# Patient Record
Sex: Female | Born: 1950 | Race: Black or African American | Hispanic: No | Smoking: Never smoker
Health system: Southern US, Community
[De-identification: ages and names within clinical notes are randomized; demographics above are authoritative.]

## PROBLEM LIST (undated history)

## (undated) DIAGNOSIS — IMO0001 Reserved for inherently not codable concepts without codable children: Secondary | ICD-10-CM

## (undated) DIAGNOSIS — S2239XA Fracture of one rib, unspecified side, initial encounter for closed fracture: Secondary | ICD-10-CM

## (undated) DIAGNOSIS — Z531 Procedure and treatment not carried out because of patient's decision for reasons of belief and group pressure: Secondary | ICD-10-CM

## (undated) DIAGNOSIS — Z923 Personal history of irradiation: Secondary | ICD-10-CM

## (undated) DIAGNOSIS — I10 Essential (primary) hypertension: Secondary | ICD-10-CM

## (undated) DIAGNOSIS — C50919 Malignant neoplasm of unspecified site of unspecified female breast: Secondary | ICD-10-CM

## (undated) DIAGNOSIS — K219 Gastro-esophageal reflux disease without esophagitis: Secondary | ICD-10-CM

## (undated) HISTORY — DX: Personal history of irradiation: Z92.3

## (undated) HISTORY — DX: Essential (primary) hypertension: I10

## (undated) HISTORY — PX: DILATION AND CURETTAGE OF UTERUS: SHX78

## (undated) HISTORY — DX: Malignant neoplasm of unspecified site of unspecified female breast: C50.919

## (undated) HISTORY — DX: Reserved for inherently not codable concepts without codable children: IMO0001

## (undated) HISTORY — DX: Procedure and treatment not carried out because of patient's decision for reasons of belief and group pressure: Z53.1

---

## 1998-07-31 ENCOUNTER — Other Ambulatory Visit: Admission: RE | Admit: 1998-07-31 | Discharge: 1998-07-31 | Payer: Self-pay | Admitting: Obstetrics and Gynecology

## 1999-06-14 ENCOUNTER — Emergency Department (HOSPITAL_COMMUNITY): Admission: EM | Admit: 1999-06-14 | Discharge: 1999-06-14 | Payer: Self-pay | Admitting: Emergency Medicine

## 2000-03-07 ENCOUNTER — Other Ambulatory Visit: Admission: RE | Admit: 2000-03-07 | Discharge: 2000-03-07 | Payer: Self-pay | Admitting: Obstetrics and Gynecology

## 2014-06-13 DIAGNOSIS — S2239XA Fracture of one rib, unspecified side, initial encounter for closed fracture: Secondary | ICD-10-CM

## 2014-06-13 HISTORY — DX: Fracture of one rib, unspecified side, initial encounter for closed fracture: S22.39XA

## 2015-02-23 ENCOUNTER — Emergency Department (HOSPITAL_COMMUNITY): Payer: Medicare Other

## 2015-02-23 ENCOUNTER — Encounter (HOSPITAL_COMMUNITY): Payer: Self-pay | Admitting: Emergency Medicine

## 2015-02-23 ENCOUNTER — Observation Stay (HOSPITAL_COMMUNITY)
Admission: EM | Admit: 2015-02-23 | Discharge: 2015-02-26 | Disposition: A | Discharge status: Home / Self Care | Payer: Medicare Other | Attending: Surgery | Admitting: Surgery

## 2015-02-23 DIAGNOSIS — R0781 Pleurodynia: Secondary | ICD-10-CM | POA: Diagnosis not present

## 2015-02-23 DIAGNOSIS — S199XXA Unspecified injury of neck, initial encounter: Secondary | ICD-10-CM | POA: Diagnosis not present

## 2015-02-23 DIAGNOSIS — S3991XA Unspecified injury of abdomen, initial encounter: Secondary | ICD-10-CM | POA: Diagnosis not present

## 2015-02-23 DIAGNOSIS — S2231XA Fracture of one rib, right side, initial encounter for closed fracture: Secondary | ICD-10-CM | POA: Diagnosis present

## 2015-02-23 DIAGNOSIS — S0990XA Unspecified injury of head, initial encounter: Secondary | ICD-10-CM | POA: Diagnosis not present

## 2015-02-23 DIAGNOSIS — S20211A Contusion of right front wall of thorax, initial encounter: Principal | ICD-10-CM | POA: Insufficient documentation

## 2015-02-23 DIAGNOSIS — S2190XA Unspecified open wound of unspecified part of thorax, initial encounter: Secondary | ICD-10-CM | POA: Diagnosis not present

## 2015-02-23 DIAGNOSIS — S2243XA Multiple fractures of ribs, bilateral, initial encounter for closed fracture: Secondary | ICD-10-CM | POA: Insufficient documentation

## 2015-02-23 DIAGNOSIS — S2249XA Multiple fractures of ribs, unspecified side, initial encounter for closed fracture: Secondary | ICD-10-CM | POA: Diagnosis not present

## 2015-02-23 DIAGNOSIS — S20219A Contusion of unspecified front wall of thorax, initial encounter: Secondary | ICD-10-CM | POA: Diagnosis present

## 2015-02-23 DIAGNOSIS — W228XXA Striking against or struck by other objects, initial encounter: Secondary | ICD-10-CM | POA: Insufficient documentation

## 2015-02-23 DIAGNOSIS — S2232XA Fracture of one rib, left side, initial encounter for closed fracture: Secondary | ICD-10-CM

## 2015-02-23 DIAGNOSIS — S298XXA Other specified injuries of thorax, initial encounter: Secondary | ICD-10-CM | POA: Diagnosis present

## 2015-02-23 DIAGNOSIS — X58XXXA Exposure to other specified factors, initial encounter: Secondary | ICD-10-CM | POA: Diagnosis not present

## 2015-02-23 DIAGNOSIS — S299XXA Unspecified injury of thorax, initial encounter: Secondary | ICD-10-CM | POA: Diagnosis not present

## 2015-02-23 DIAGNOSIS — T1490XA Injury, unspecified, initial encounter: Secondary | ICD-10-CM

## 2015-02-23 DIAGNOSIS — S2242XA Multiple fractures of ribs, left side, initial encounter for closed fracture: Secondary | ICD-10-CM | POA: Diagnosis not present

## 2015-02-23 LAB — CBC
HEMATOCRIT: 41.3 % (ref 36.0–46.0)
HEMOGLOBIN: 13.2 g/dL (ref 12.0–15.0)
MCH: 29.4 pg (ref 26.0–34.0)
MCHC: 32 g/dL (ref 30.0–36.0)
MCV: 92 fL (ref 78.0–100.0)
Platelets: 221 10*3/uL (ref 150–400)
RBC: 4.49 MIL/uL (ref 3.87–5.11)
RDW: 12.3 % (ref 11.5–15.5)
WBC: 13.9 10*3/uL — ABNORMAL HIGH (ref 4.0–10.5)

## 2015-02-23 LAB — COMPREHENSIVE METABOLIC PANEL
ALBUMIN: 3.8 g/dL (ref 3.5–5.0)
ALK PHOS: 112 U/L (ref 38–126)
ALT: 27 U/L (ref 14–54)
ANION GAP: 12 (ref 5–15)
AST: 33 U/L (ref 15–41)
BILIRUBIN TOTAL: 0.6 mg/dL (ref 0.3–1.2)
BUN: 15 mg/dL (ref 6–20)
CALCIUM: 9.6 mg/dL (ref 8.9–10.3)
CO2: 19 mmol/L — AB (ref 22–32)
Chloride: 110 mmol/L (ref 101–111)
Creatinine, Ser: 1.41 mg/dL — ABNORMAL HIGH (ref 0.44–1.00)
GFR calc non Af Amer: 38 mL/min — ABNORMAL LOW (ref >60)
GFR, EST AFRICAN AMERICAN: 44 mL/min — AB (ref >60)
Glucose, Bld: 163 mg/dL — ABNORMAL HIGH (ref 65–99)
POTASSIUM: 3.9 mmol/L (ref 3.5–5.1)
SODIUM: 141 mmol/L (ref 135–145)
TOTAL PROTEIN: 8.2 g/dL — AB (ref 6.5–8.1)

## 2015-02-23 LAB — SAMPLE TO BLOOD BANK

## 2015-02-23 LAB — PROTIME-INR
INR: 1.07 (ref 0.00–1.49)
PROTHROMBIN TIME: 14.1 s (ref 11.6–15.2)

## 2015-02-23 LAB — ETHANOL

## 2015-02-23 MED ORDER — ENOXAPARIN SODIUM 40 MG/0.4ML ~~LOC~~ SOLN
40.0000 mg | SUBCUTANEOUS | Status: DC
  Administered 2015-02-24: 40 mg via SUBCUTANEOUS
  Filled 2015-02-23 (×3): qty 0.4

## 2015-02-23 MED ORDER — DEXTROSE-NACL 5-0.9 % IV SOLN
INTRAVENOUS | Status: DC
  Administered 2015-02-24: 01:00:00 via INTRAVENOUS

## 2015-02-23 MED ORDER — IOHEXOL 300 MG/ML  SOLN
100.0000 mL | Freq: Once | INTRAMUSCULAR | Status: AC | PRN
  Administered 2015-02-23: 100 mL via INTRAVENOUS

## 2015-02-23 MED ORDER — FENTANYL CITRATE (PF) 100 MCG/2ML IJ SOLN
INTRAMUSCULAR | Status: AC
  Filled 2015-02-23: qty 2

## 2015-02-23 MED ORDER — OXYCODONE HCL 5 MG PO TABS: 10.0000 mg | ORAL_TABLET | ORAL | Status: DC | PRN

## 2015-02-23 MED ORDER — ONDANSETRON HCL 4 MG PO TABS
4.0000 mg | ORAL_TABLET | Freq: Four times a day (QID) | ORAL | Status: DC | PRN
Start: 2015-02-23 — End: 2015-02-26

## 2015-02-23 MED ORDER — TETANUS-DIPHTH-ACELL PERTUSSIS 5-2.5-18.5 LF-MCG/0.5 IM SUSP
INTRAMUSCULAR | Status: AC
  Filled 2015-02-23: qty 0.5

## 2015-02-23 MED ORDER — SODIUM CHLORIDE 0.9 % IV SOLN
1000.0000 mL | Freq: Once | INTRAVENOUS | Status: AC
  Administered 2015-02-23: 1000 mL via INTRAVENOUS

## 2015-02-23 MED ORDER — TETANUS-DIPHTH-ACELL PERTUSSIS 5-2.5-18.5 LF-MCG/0.5 IM SUSP
0.5000 mL | Freq: Once | INTRAMUSCULAR | Status: AC
  Administered 2015-02-23: 0.5 mL via INTRAMUSCULAR

## 2015-02-23 MED ORDER — HYDROMORPHONE HCL 1 MG/ML IJ SOLN
INTRAMUSCULAR | Status: AC
  Administered 2015-02-24: 1 mg via INTRAVENOUS
  Filled 2015-02-23: qty 1

## 2015-02-23 MED ORDER — SODIUM CHLORIDE 0.9 % IV SOLN
1000.0000 mL | INTRAVENOUS | Status: DC
  Administered 2015-02-23: 1000 mL via INTRAVENOUS

## 2015-02-23 MED ORDER — HYDROMORPHONE HCL 1 MG/ML IJ SOLN
1.0000 mg | INTRAMUSCULAR | Status: DC | PRN
  Administered 2015-02-23 – 2015-02-24 (×3): 1 mg via INTRAVENOUS
  Filled 2015-02-23 (×2): qty 1

## 2015-02-23 MED ORDER — FENTANYL CITRATE (PF) 100 MCG/2ML IJ SOLN
INTRAMUSCULAR | Status: DC | PRN
  Administered 2015-02-23: 50 ug via INTRAVENOUS

## 2015-02-23 MED ORDER — ONDANSETRON HCL 4 MG/2ML IJ SOLN: 4.0000 mg | Freq: Four times a day (QID) | INTRAMUSCULAR | Status: DC | PRN

## 2015-02-23 NOTE — ED Provider Notes (Signed)
CSN: [id number]     Arrival date & time 02/23/15  1911 History   First MD Initiated Contact with Patient 02/23/15 1923     Chief Complaint  Patient presents with  . Trauma     (Consider location/radiation/quality/duration/timing/severity/associated sxs/prior Treatment) Patient is a 64 y.o. female presenting with trauma.  Trauma Mechanism of injury: crush injury Injury location: torso Injury location detail: back and R chest Incident location: home Time since incident: 1 hour Arrived directly from scene: yes  Crush injury:      Mechanism: gazebo fell on her.      Duration of crushing force: 30 seconds   Protective equipment:       None  EMS/PTA data:      Blood loss: none      Responsiveness: alert      Loss of consciousness: no      Amnesic to event: no      Immobilization: C-collar  Current symptoms:      Pain scale: 10/10      Pain quality: stabbing      Associated symptoms:            Reports back pain and chest pain.            Denies abdominal pain, headache, loss of consciousness, nausea and vomiting.   Relevant PMH:      Pharmacological risk factors:            No anticoagulation therapy.       Tetanus status: UTD   History reviewed. No pertinent past medical history. History reviewed. No pertinent past surgical history. No family history on file. Social History  Substance Use Topics  . Smoking status: Never Smoker   . Smokeless tobacco: None  . Alcohol Use: None   OB History    No data available     Review of Systems  Constitutional: Negative for fever and chills.  HENT: Negative for congestion and sore throat.   Eyes: Negative for visual disturbance.  Respiratory: Positive for shortness of breath. Negative for cough and wheezing.   Cardiovascular: Positive for chest pain.  Gastrointestinal: Negative for nausea, vomiting, abdominal pain, diarrhea and constipation.  Genitourinary: Negative for dysuria, difficulty urinating and vaginal pain.    Musculoskeletal: Positive for back pain.  Skin: Negative for rash.  Neurological: Negative for loss of consciousness, syncope and headaches.  Psychiatric/Behavioral: Negative for behavioral problems.  All other systems reviewed and are negative.     Allergies  Review of patient's allergies indicates no known allergies.  Home Medications   Prior to Admission medications   Not on File   BP 99/66 mmHg  Pulse 88  Temp(Src) 97.9 F (36.6 C) (Oral)  Resp 29  Ht 5\' 5"  (1.651 m)  Wt 237 lb (107.502 kg)  BMI 39.44 kg/m2  SpO2 94% Physical Exam  Constitutional: She is oriented to person, place, and time. She appears well-developed and well-nourished. She appears distressed.  HENT:  Head: Normocephalic and atraumatic.  Eyes: EOM are normal.  Neck: Normal range of motion.  Cardiovascular: Normal rate, regular rhythm and normal heart sounds.   No murmur heard. Pulmonary/Chest: Breath sounds normal. No respiratory distress. She has no wheezes.   She exhibits tenderness.    Abdominal: Soft. She exhibits no distension. There is no tenderness.  Musculoskeletal: She exhibits tenderness.       Cervical back: She exhibits no tenderness.       Thoracic back: She exhibits tenderness.  Neurological: She is  alert and oriented to person, place, and time.  Skin: She is not diaphoretic.  Psychiatric: She has a normal mood and affect. Her behavior is normal.    ED Course  Procedures (including critical care time) Labs Review Labs Reviewed  COMPREHENSIVE METABOLIC PANEL - Abnormal; Notable for the following:    CO2 19 (*)    Glucose, Bld 163 (*)    Creatinine, Ser 1.41 (*)    Total Protein 8.2 (*)    GFR calc non Af Amer 38 (*)    GFR calc Af Amer 44 (*)    All other components within normal limits  CBC - Abnormal; Notable for the following:    WBC 13.9 (*)    All other components within normal limits  ETHANOL  PROTIME-INR  CBC  COMPREHENSIVE METABOLIC PANEL  SAMPLE TO BLOOD  BANK    Imaging Review Ct Head Wo Contrast  02/23/2015   CLINICAL DATA:  64 year old female with trauma  EXAM: CT HEAD WITHOUT CONTRAST  CT CERVICAL SPINE WITHOUT CONTRAST  TECHNIQUE: Multidetector CT imaging of the head and cervical spine was performed following the standard protocol without intravenous contrast. Multiplanar CT image reconstructions of the cervical spine were also generated.  COMPARISON:  None.  FINDINGS: CT HEAD FINDINGS  The ventricles and sulci are appropriate in size for the patient's age. There is no intracranial hemorrhage. No mass effect or midline shift identified. The gray-white matter differentiation is preserved. There is no extra-axial fluid collection.  The visualized paranasal sinuses and mastoid air cells are well aerated. The calvarium is intact.  CT CERVICAL SPINE FINDINGS  There is no acute fracture or subluxation of the cervical spine.Mild degenerative changes.The odontoid and spinous processes are intact.There is normal anatomic alignment of the C1-C2 lateral masses. The visualized soft tissues appear unremarkable.  IMPRESSION: No acute intracranial pathology.  No acute/ traumatic cervical spine pathology.   Electronically Signed   By: Anner Crete M.D.   On: 02/23/2015 21:43   Ct Chest W Contrast  02/23/2015   CLINICAL DATA:  64 year old female with trauma  EXAM: CT CHEST, ABDOMEN, AND PELVIS WITH CONTRAST  TECHNIQUE: Multidetector CT imaging of the chest, abdomen and pelvis was performed following the standard protocol during bolus administration of intravenous contrast.  CONTRAST:  114mL OMNIPAQUE IOHEXOL 300 MG/ML  SOLN  COMPARISON:  None.  FINDINGS: CT CHEST FINDINGS  Bibasilar dependent atelectatic changes/scarring. There is no focal consolidation, pleural effusion, or pneumothorax. The central airways are patent.  The thoracic aorta and pulmonary arteries appear unremarkable. No cardiomegaly or pericardial effusion. The thyroid gland is unremarkable. The  esophagus is predominantly collapsed. A small pocket of air is noted posterior to the sternum.  There is no axillary adenopathy. Small amount of air noted in the right chest wall deep to the pectoralis muscle. There is a mildly displaced fracture of the left anterior sixth rib. Nondisplaced fractures of the left fifth and seventh ribs noted. There is a nondisplaced fracture of the posterior right eighth rib.  CT ABDOMEN AND PELVIS FINDINGS  There is no intra-abdominal free air or free fluid.  The liver, gallbladder, pancreas, spleen, adrenal glands, kidneys, visualized ureters, and urinary bladder appear unremarkable. Multiple uterine lesions noted with the largest measuring approximately 5.0 x 3.8 cm along the right uterine body. An intracavitary lesion as well as an exophytic and partially calcified lesion noted. These findings are most compatible with fibroids. Ultrasound recommended for further evaluation of the pelvic structures with  There is  no evidence of bowel obstruction or inflammation. Normal appendix.  The abdominal aorta and IVC appear unremarkable. No portal venous gas identified. There is no lymphadenopathy. The osseous structures are intact. 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 Stop  IMPRESSION: Bilateral rib fractures with a small amount of soft tissue air in the right anterior chest wall. No pneumothorax.  No acute/ traumatic intra-abdominal or pelvic pathology identified.  The above findings were discussed in person with Dr. Brantley Stage on 02/23/2015 at 9:18 pm.   Electronically Signed   By: Anner Crete M.D.   On: 02/23/2015 21:40   Ct Cervical Spine Wo Contrast  02/23/2015   CLINICAL DATA:  64 year old female with trauma  EXAM: CT HEAD WITHOUT CONTRAST  CT CERVICAL SPINE WITHOUT CONTRAST  TECHNIQUE: Multidetector CT imaging of the head and cervical spine was performed following the standard protocol without intravenous contrast. Multiplanar CT image  reconstructions of the cervical spine were also generated.  COMPARISON:  None.  FINDINGS: CT HEAD FINDINGS  The ventricles and sulci are appropriate in size for the patient's age. There is no intracranial hemorrhage. No mass effect or midline shift identified. The gray-white matter differentiation is preserved. There is no extra-axial fluid collection.  The visualized paranasal sinuses and mastoid air cells are well aerated. The calvarium is intact.  CT CERVICAL SPINE FINDINGS  There is no acute fracture or subluxation of the cervical spine.Mild degenerative changes.The odontoid and spinous processes are intact.There is normal anatomic alignment of the C1-C2 lateral masses. The visualized soft tissues appear unremarkable.  IMPRESSION: No acute intracranial pathology.  No acute/ traumatic cervical spine pathology.   Electronically Signed   By: Anner Crete M.D.   On: 02/23/2015 21:43   Ct Abdomen Pelvis W Contrast  02/23/2015   CLINICAL DATA:  64 year old female with trauma  EXAM: CT CHEST, ABDOMEN, AND PELVIS WITH CONTRAST  TECHNIQUE: Multidetector CT imaging of the chest, abdomen and pelvis was performed following the standard protocol during bolus administration of intravenous contrast.  CONTRAST:  129mL OMNIPAQUE IOHEXOL 300 MG/ML  SOLN  COMPARISON:  None.  FINDINGS: CT CHEST FINDINGS  Bibasilar dependent atelectatic changes/scarring. There is no focal consolidation, pleural effusion, or pneumothorax. The central airways are patent.  The thoracic aorta and pulmonary arteries appear unremarkable. No cardiomegaly or pericardial effusion. The thyroid gland is unremarkable. The esophagus is predominantly collapsed. A small pocket of air is noted posterior to the sternum.  There is no axillary adenopathy. Small amount of air noted in the right chest wall deep to the pectoralis muscle. There is a mildly displaced fracture of the left anterior sixth rib. Nondisplaced fractures of the left fifth and seventh ribs  noted. There is a nondisplaced fracture of the posterior right eighth rib.  CT ABDOMEN AND PELVIS FINDINGS  There is no intra-abdominal free air or free fluid.  The liver, gallbladder, pancreas, spleen, adrenal glands, kidneys, visualized ureters, and urinary bladder appear unremarkable. Multiple uterine lesions noted with the largest measuring approximately 5.0 x 3.8 cm along the right uterine body. An intracavitary lesion as well as an exophytic and partially calcified lesion noted. These findings are most compatible with fibroids. Ultrasound recommended for further evaluation of the pelvic structures with  There is no evidence of bowel obstruction or inflammation. Normal appendix.  The abdominal aorta and IVC appear unremarkable. No portal venous gas identified. There is no lymphadenopathy. The osseous structures are intact. A 4 x 6 cm intramuscular lipoma  is noted in the left gluteus region. Stop  IMPRESSION: Bilateral rib fractures with a small amount of soft tissue air in the right anterior chest wall. No pneumothorax.  No acute/ traumatic intra-abdominal or pelvic pathology identified.  The above findings were discussed in person with Dr. Brantley Stage on 02/23/2015 at 9:18 pm.   Electronically Signed   By: Anner Crete M.D.   On: 02/23/2015 21:40   Dg Chest Portable 1 View  02/23/2015   CLINICAL DATA:  64 year old female trauma  EXAM: PORTABLE CHEST - 1 VIEW  COMPARISON:  None.  FINDINGS: Single portable view of the chest demonstrate shallow inspiratory effort. There is no focal consolidation, pleural effusion or pneumothorax. A patchy area of opacity in the upper mediastinum may be artifactual or represent an area of contusion. CT is recommended for further evaluation if there is high clinical concern for mediastinal injury. The cardiac silhouette is within normal limits. The osseous structures appear unremarkable.  IMPRESSION: Ill-defined area opacity over the upper mediastinum may be artifactual or  represent an area of contusion. No other intrathoracic traumatic injury identified.   Electronically Signed   By: Anner Crete M.D.   On: 02/23/2015 19:56   I have personally reviewed and evaluated these images and lab results as part of my medical decision-making.   EKG Interpretation None      MDM   Final diagnoses:  Rib fracture, left, closed, initial encounter  Rib fracture, right, closed, initial encounter     Patient is a 64 year old female that presents after a gazebo fell on top of her. Patient was under the gazebo for approximately 30 seconds denies LOC. Patient now has significant upper back and right-sided chest pain with shortness of breath. On arrival to the ED the patient is tachypneic and in distress. Patient's blood pressure was 90/60. A fast was performed which showed no intraperitoneal or cardiac free fluid however the patient's lung sliding was indeterminate on the right. IV fluids started and patient was upgraded to a level II. Full traumatic workup will be done.  Patient's workup showed bilateral rib fractures with subcutaneous air however no pneumothorax was seen. Patient also has a creatinine 1.5 however there is no baseline to compare to. Patient will be admitted to trauma surgery for pain medication and further evaluation.  Renne Musca, MD 02/23/15 2255  Charlesetta Shanks, MD 02/26/15 [phone]
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
## 2015-02-23 NOTE — ED Notes (Signed)
Pt taken to CT scan.

## 2015-02-23 NOTE — ED Notes (Signed)
Pt returned from CT scan.

## 2015-02-23 NOTE — ED Notes (Signed)
DR. Johnney Killian at the bedside.

## 2015-02-24 ENCOUNTER — Observation Stay (HOSPITAL_COMMUNITY): Payer: Medicare Other

## 2015-02-24 DIAGNOSIS — S2231XA Fracture of one rib, right side, initial encounter for closed fracture: Secondary | ICD-10-CM | POA: Diagnosis present

## 2015-02-24 DIAGNOSIS — S2249XA Multiple fractures of ribs, unspecified side, initial encounter for closed fracture: Secondary | ICD-10-CM | POA: Diagnosis not present

## 2015-02-24 DIAGNOSIS — S20211A Contusion of right front wall of thorax, initial encounter: Secondary | ICD-10-CM | POA: Diagnosis not present

## 2015-02-24 DIAGNOSIS — S298XXA Other specified injuries of thorax, initial encounter: Secondary | ICD-10-CM | POA: Diagnosis present

## 2015-02-24 LAB — MRSA PCR SCREENING: MRSA BY PCR: NEGATIVE

## 2015-02-24 LAB — COMPREHENSIVE METABOLIC PANEL
ALT: 33 U/L (ref 14–54)
ANION GAP: 7 (ref 5–15)
AST: 79 U/L — ABNORMAL HIGH (ref 15–41)
Albumin: 3.6 g/dL (ref 3.5–5.0)
Alkaline Phosphatase: 102 U/L (ref 38–126)
BILIRUBIN TOTAL: 0.7 mg/dL (ref 0.3–1.2)
BUN: 12 mg/dL (ref 6–20)
CO2: 22 mmol/L (ref 22–32)
Calcium: 9.3 mg/dL (ref 8.9–10.3)
Chloride: 113 mmol/L — ABNORMAL HIGH (ref 101–111)
Creatinine, Ser: 0.95 mg/dL (ref 0.44–1.00)
GFR calc Af Amer: >60 mL/min (ref >60)
Glucose, Bld: 152 mg/dL — ABNORMAL HIGH (ref 65–99)
POTASSIUM: 4.3 mmol/L (ref 3.5–5.1)
Sodium: 142 mmol/L (ref 135–145)
TOTAL PROTEIN: 7.4 g/dL (ref 6.5–8.1)

## 2015-02-24 LAB — CBC
HEMATOCRIT: 39.1 % (ref 36.0–46.0)
Hemoglobin: 12.3 g/dL (ref 12.0–15.0)
MCH: 29.3 pg (ref 26.0–34.0)
MCHC: 31.5 g/dL (ref 30.0–36.0)
MCV: 93.1 fL (ref 78.0–100.0)
Platelets: 180 10*3/uL (ref 150–400)
RBC: 4.2 MIL/uL (ref 3.87–5.11)
RDW: 12.4 % (ref 11.5–15.5)
WBC: 14.2 10*3/uL — ABNORMAL HIGH (ref 4.0–10.5)

## 2015-02-24 MED ORDER — WHITE PETROLATUM GEL
Status: AC
  Administered 2015-02-24: 01:00:00
  Filled 2015-02-24: qty 1

## 2015-02-24 MED ORDER — OXYCODONE HCL 5 MG PO TABS
5.0000 mg | ORAL_TABLET | ORAL | Status: DC | PRN
  Administered 2015-02-24 – 2015-02-26 (×8): 15 mg via ORAL
  Filled 2015-02-24 (×9): qty 3

## 2015-02-24 MED ORDER — HYDROMORPHONE HCL 1 MG/ML IJ SOLN
0.5000 mg | INTRAMUSCULAR | Status: DC | PRN
  Administered 2015-02-24 – 2015-02-25 (×3): 0.5 mg via INTRAVENOUS
  Filled 2015-02-24 (×3): qty 1

## 2015-02-24 MED ORDER — DOCUSATE SODIUM 100 MG PO CAPS
100.0000 mg | ORAL_CAPSULE | Freq: Two times a day (BID) | ORAL | Status: DC
  Administered 2015-02-24 – 2015-02-26 (×5): 100 mg via ORAL
  Filled 2015-02-24 (×5): qty 1

## 2015-02-24 MED ORDER — POLYETHYLENE GLYCOL 3350 17 G PO PACK
17.0000 g | PACK | Freq: Every day | ORAL | Status: DC
  Administered 2015-02-25 – 2015-02-26 (×2): 17 g via ORAL
  Filled 2015-02-24 (×3): qty 1

## 2015-02-24 MED ORDER — INFLUENZA VAC SPLIT QUAD 0.5 ML IM SUSY
0.5000 mL | PREFILLED_SYRINGE | INTRAMUSCULAR | Status: AC
  Administered 2015-02-25: 0.5 mL via INTRAMUSCULAR
  Filled 2015-02-24 (×2): qty 0.5

## 2015-02-24 MED ORDER — ENOXAPARIN SODIUM 30 MG/0.3ML ~~LOC~~ SOLN
30.0000 mg | Freq: Two times a day (BID) | SUBCUTANEOUS | Status: DC
  Administered 2015-02-24 – 2015-02-26 (×4): 30 mg via SUBCUTANEOUS
  Filled 2015-02-24 (×4): qty 0.3
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
## 2015-02-25 DIAGNOSIS — S2249XA Multiple fractures of ribs, unspecified side, initial encounter for closed fracture: Secondary | ICD-10-CM | POA: Diagnosis not present

## 2015-02-25 MED ORDER — TRAMADOL HCL 50 MG PO TABS
50.0000 mg | ORAL_TABLET | Freq: Four times a day (QID) | ORAL | Status: DC
  Administered 2015-02-25 – 2015-02-26 (×4): 50 mg via ORAL
  Filled 2015-02-25 (×4): qty 1
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
## 2015-02-26 DIAGNOSIS — S2249XA Multiple fractures of ribs, unspecified side, initial encounter for closed fracture: Secondary | ICD-10-CM | POA: Diagnosis not present

## 2015-02-26 MED ORDER — OXYCODONE-ACETAMINOPHEN 7.5-325 MG PO TABS: 1.0000 | ORAL_TABLET | ORAL | Status: DC | PRN

## 2015-02-26 MED ORDER — TRAMADOL HCL 50 MG PO TABS: 50.0000 mg | ORAL_TABLET | Freq: Four times a day (QID) | ORAL | Status: DC

## 2015-02-26 NOTE — Progress Notes (Signed)
Patient  discharged to home with instruction. 
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
## 2015-02-26 NOTE — Discharge Instructions (Signed)
No driving while taking oxycodone. °

## 2015-03-02 ENCOUNTER — Telehealth: Payer: Self-pay | Admitting: Emergency Medicine

## 2015-03-02 NOTE — Telephone Encounter (Signed)
LM on VM stating not likely allergic rxn or side effect from pain meds and to call PCP.

## 2015-08-05 DIAGNOSIS — I10 Essential (primary) hypertension: Secondary | ICD-10-CM | POA: Diagnosis not present

## 2016-09-23 ENCOUNTER — Encounter: Payer: Self-pay | Admitting: Gastroenterology

## 2016-09-26 ENCOUNTER — Other Ambulatory Visit: Payer: Self-pay | Admitting: Internal Medicine

## 2016-09-26 DIAGNOSIS — E2839 Other primary ovarian failure: Secondary | ICD-10-CM

## 2016-09-27 ENCOUNTER — Other Ambulatory Visit: Payer: Self-pay | Admitting: Internal Medicine

## 2016-09-27 DIAGNOSIS — Z1231 Encounter for screening mammogram for malignant neoplasm of breast: Secondary | ICD-10-CM

## 2016-10-18 ENCOUNTER — Ambulatory Visit
Admission: RE | Admit: 2016-10-18 | Discharge: 2016-10-18 | Disposition: A | Discharge status: Home / Self Care | Payer: Medicare Other | Source: Ambulatory Visit | Attending: Internal Medicine | Admitting: Internal Medicine

## 2016-10-18 DIAGNOSIS — E2839 Other primary ovarian failure: Secondary | ICD-10-CM

## 2016-10-18 DIAGNOSIS — Z1231 Encounter for screening mammogram for malignant neoplasm of breast: Secondary | ICD-10-CM

## 2016-10-19 ENCOUNTER — Other Ambulatory Visit: Payer: Self-pay | Admitting: Internal Medicine

## 2016-10-19 DIAGNOSIS — R928 Other abnormal and inconclusive findings on diagnostic imaging of breast: Secondary | ICD-10-CM

## 2016-10-25 ENCOUNTER — Ambulatory Visit
Admission: RE | Admit: 2016-10-25 | Discharge: 2016-10-25 | Disposition: A | Discharge status: Home / Self Care | Payer: Medicare Other | Source: Ambulatory Visit | Attending: Internal Medicine | Admitting: Internal Medicine

## 2016-10-25 ENCOUNTER — Other Ambulatory Visit: Payer: Self-pay | Admitting: Internal Medicine

## 2016-10-25 DIAGNOSIS — R928 Other abnormal and inconclusive findings on diagnostic imaging of breast: Secondary | ICD-10-CM

## 2016-10-25 DIAGNOSIS — N6310 Unspecified lump in the right breast, unspecified quadrant: Secondary | ICD-10-CM

## 2016-10-25 DIAGNOSIS — N6320 Unspecified lump in the left breast, unspecified quadrant: Secondary | ICD-10-CM

## 2016-10-25 DIAGNOSIS — R599 Enlarged lymph nodes, unspecified: Secondary | ICD-10-CM

## 2016-10-25 HISTORY — PX: BREAST BIOPSY: SHX20

## 2016-10-28 ENCOUNTER — Ambulatory Visit
Admission: RE | Admit: 2016-10-28 | Discharge: 2016-10-28 | Disposition: A | Discharge status: Home / Self Care | Payer: Medicare Other | Source: Ambulatory Visit | Attending: Internal Medicine | Admitting: Internal Medicine

## 2016-10-28 ENCOUNTER — Other Ambulatory Visit: Payer: Self-pay | Admitting: Internal Medicine

## 2016-10-28 DIAGNOSIS — N6310 Unspecified lump in the right breast, unspecified quadrant: Secondary | ICD-10-CM

## 2016-10-28 DIAGNOSIS — R928 Other abnormal and inconclusive findings on diagnostic imaging of breast: Secondary | ICD-10-CM

## 2016-10-28 DIAGNOSIS — N6320 Unspecified lump in the left breast, unspecified quadrant: Secondary | ICD-10-CM

## 2016-10-28 DIAGNOSIS — R599 Enlarged lymph nodes, unspecified: Secondary | ICD-10-CM

## 2016-10-31 ENCOUNTER — Telehealth: Payer: Self-pay | Admitting: *Deleted

## 2016-10-31 ENCOUNTER — Ambulatory Visit (AMBULATORY_SURGERY_CENTER): Payer: Self-pay | Admitting: *Deleted

## 2016-10-31 VITALS — Ht 65.0 in | Wt 259.0 lb

## 2016-10-31 DIAGNOSIS — C50919 Malignant neoplasm of unspecified site of unspecified female breast: Secondary | ICD-10-CM

## 2016-10-31 DIAGNOSIS — Z1211 Encounter for screening for malignant neoplasm of colon: Secondary | ICD-10-CM

## 2016-10-31 HISTORY — DX: Malignant neoplasm of unspecified site of unspecified female breast: C50.919

## 2016-10-31 MED ORDER — NA SULFATE-K SULFATE-MG SULF 17.5-3.13-1.6 GM/177ML PO SOLN: ORAL | 0 refills | Status: DC

## 2016-10-31 NOTE — Telephone Encounter (Signed)
Patient came in today for PV for direct screening colonoscopy. She was notified TODAY that her breast bx that was done on Friday was positive for breast cancer she will be meeting with her team of dr's on Wednesday. As of now she does not know what treatment she will be recommended. From your standpoint is patient okay to still have this colonoscopy on 11/29/16? Please advise. Thank you,Robbin PV

## 2016-10-31 NOTE — Progress Notes (Signed)
Patient denies any allergies to eggs or soy. Patient denies any problems with anesthesia/sedation. Patient denies any oxygen use at home and does not take any diet/weight loss medications. EMMI education assisgned to patient on colonoscopy, this was explained and instructions given to patient. 

## 2016-10-31 NOTE — Telephone Encounter (Signed)
I imagine she'll be starting some type of treatment for her breast cancer (surgery, chemo, xrt or a combination of those).  As long as she is not actively getting chemotherapy at the time of her colonoscopy I think it should be fine if she still wants to go ahead with it.  IF she prefers to wait 6 months or so that is fine with me as well.

## 2016-10-31 NOTE — Telephone Encounter (Signed)
Spoke with patient. Gave her Dr.Jacobs recommendations. She states she will let her team of dr's know this and then decide if she should have the colon now or later. She will let us know.

## 2016-11-03 ENCOUNTER — Other Ambulatory Visit: Payer: Self-pay | Admitting: *Deleted

## 2016-11-03 ENCOUNTER — Encounter: Payer: Self-pay | Admitting: *Deleted

## 2016-11-03 ENCOUNTER — Telehealth: Payer: Self-pay | Admitting: *Deleted

## 2016-11-03 DIAGNOSIS — C50411 Malignant neoplasm of upper-outer quadrant of right female breast: Secondary | ICD-10-CM | POA: Insufficient documentation

## 2016-11-03 DIAGNOSIS — Z170 Estrogen receptor positive status [ER+]: Secondary | ICD-10-CM

## 2016-11-03 NOTE — Telephone Encounter (Signed)
Left vm regarding [facility] for 5.30.18. Contact information provided.

## 2016-11-04 ENCOUNTER — Telehealth: Payer: Self-pay | Admitting: *Deleted

## 2016-11-04 NOTE — Telephone Encounter (Signed)
Confirmed [facility] for 11/09/16 at 0815 .  Instructions and contact information given.  

## 2016-11-09 ENCOUNTER — Other Ambulatory Visit (HOSPITAL_BASED_OUTPATIENT_CLINIC_OR_DEPARTMENT_OTHER): Payer: Medicare Other

## 2016-11-09 ENCOUNTER — Ambulatory Visit: Payer: Medicare Other | Attending: General Surgery | Admitting: Physical Therapy

## 2016-11-09 ENCOUNTER — Encounter: Payer: Self-pay | Admitting: Hematology and Oncology

## 2016-11-09 ENCOUNTER — Ambulatory Visit: Admission: RE | Admit: 2016-11-09 | Payer: Medicare Other | Source: Ambulatory Visit | Admitting: Radiation Oncology

## 2016-11-09 ENCOUNTER — Encounter: Payer: Self-pay | Admitting: Physical Therapy

## 2016-11-09 ENCOUNTER — Ambulatory Visit
Admission: RE | Admit: 2016-11-09 | Discharge: 2016-11-09 | Disposition: A | Discharge status: Home / Self Care | Payer: Medicare Other | Source: Ambulatory Visit | Attending: Radiation Oncology | Admitting: Radiation Oncology

## 2016-11-09 ENCOUNTER — Ambulatory Visit (HOSPITAL_BASED_OUTPATIENT_CLINIC_OR_DEPARTMENT_OTHER): Payer: Medicare Other | Admitting: Hematology and Oncology

## 2016-11-09 ENCOUNTER — Encounter: Payer: Self-pay | Admitting: *Deleted

## 2016-11-09 ENCOUNTER — Other Ambulatory Visit: Payer: Self-pay | Admitting: *Deleted

## 2016-11-09 ENCOUNTER — Telehealth: Payer: Self-pay | Admitting: *Deleted

## 2016-11-09 DIAGNOSIS — C50211 Malignant neoplasm of upper-inner quadrant of right female breast: Secondary | ICD-10-CM

## 2016-11-09 DIAGNOSIS — Z170 Estrogen receptor positive status [ER+]: Secondary | ICD-10-CM | POA: Insufficient documentation

## 2016-11-09 DIAGNOSIS — C50411 Malignant neoplasm of upper-outer quadrant of right female breast: Secondary | ICD-10-CM

## 2016-11-09 DIAGNOSIS — C773 Secondary and unspecified malignant neoplasm of axilla and upper limb lymph nodes: Secondary | ICD-10-CM

## 2016-11-09 DIAGNOSIS — R293 Abnormal posture: Secondary | ICD-10-CM | POA: Insufficient documentation

## 2016-11-09 LAB — CBC WITH DIFFERENTIAL/PLATELET
BASO%: 0.1 % (ref 0.0–2.0)
Basophils Absolute: 0 10*3/uL (ref 0.0–0.1)
EOS%: 1.1 % (ref 0.0–7.0)
Eosinophils Absolute: 0.1 10*3/uL (ref 0.0–0.5)
HCT: 40.8 % (ref 34.8–46.6)
HGB: 13.2 g/dL (ref 11.6–15.9)
LYMPH%: 36.5 % (ref 14.0–49.7)
MCH: 30.1 pg (ref 25.1–34.0)
MCHC: 32.4 g/dL (ref 31.5–36.0)
MCV: 92.9 fL (ref 79.5–101.0)
MONO#: 0.4 10*3/uL (ref 0.1–0.9)
MONO%: 4.6 % (ref 0.0–14.0)
NEUT#: 5.3 10*3/uL (ref 1.5–6.5)
NEUT%: 57.7 % (ref 38.4–76.8)
Platelets: 197 10*3/uL (ref 145–400)
RBC: 4.39 10*6/uL (ref 3.70–5.45)
RDW: 12.2 % (ref 11.2–14.5)
WBC: 9.2 10*3/uL (ref 3.9–10.3)
lymph#: 3.4 10*3/uL — ABNORMAL HIGH (ref 0.9–3.3)

## 2016-11-09 LAB — COMPREHENSIVE METABOLIC PANEL
ALT: 25 U/L (ref 0–55)
AST: 21 U/L (ref 5–34)
Albumin: 3.8 g/dL (ref 3.5–5.0)
Alkaline Phosphatase: 109 U/L (ref 40–150)
Anion Gap: 7 mEq/L (ref 3–11)
BUN: 9.4 mg/dL (ref 7.0–26.0)
CHLORIDE: 108 meq/L (ref 98–109)
CO2: 27 meq/L (ref 22–29)
CREATININE: 0.8 mg/dL (ref 0.6–1.1)
Calcium: 10.4 mg/dL (ref 8.4–10.4)
EGFR: 84 mL/min/{1.73_m2} — ABNORMAL LOW (ref >90)
GLUCOSE: 96 mg/dL (ref 70–140)
Potassium: 3.9 mEq/L (ref 3.5–5.1)
SODIUM: 142 meq/L (ref 136–145)
Total Bilirubin: 0.45 mg/dL (ref 0.20–1.20)
Total Protein: 8 g/dL (ref 6.4–8.3)

## 2016-11-09 MED ORDER — ANASTROZOLE 1 MG PO TABS: 1.0000 mg | ORAL_TABLET | Freq: Every day | ORAL | 3 refills | Status: DC

## 2016-11-09 NOTE — Patient Instructions (Signed)

## 2016-11-09 NOTE — Progress Notes (Signed)
Central NOTE  Patient Care Team: Nolene Ebbs, MD as PCP - General (Internal Medicine) Stark Klein, MD as Consulting Physician (General Surgery) Nicholas Lose, MD as Consulting Physician (Hematology and Oncology) Gery Pray, MD as Consulting Physician (Radiation Oncology)  CHIEF COMPLAINTS/PURPOSE OF CONSULTATION:  Newly diagnosed breast cancer  HISTORY OF PRESENTING ILLNESS:  Sharon Reid 66 y.o. female is here because of recent diagnosis of right breast cancer. Patient had a screening mammogram the detected a right breast distortion identified position. There was also axillary lymphadenopathy. 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  I reviewed her records extensively and collaborated the history with the patient.  SUMMARY OF ONCOLOGIC HISTORY:   Malignant neoplasm of upper-outer quadrant of right breast in female, estrogen receptor positive ([place])   10/28/2016 Initial Diagnosis    Screening detected right breast distortion at UOQ 10:00: 2.4 cm with abnormal lymph node in the axilla, biopsy invasive lobular cancer with LCIS ER 90%, PR 80%, Ki-67 10%, HER-2 negative ratio 1.48, lymph node biopsy also positive T2 N0 stage II a (New AJCC)       MEDICAL HISTORY:  Past Medical History:  Diagnosis Date  . Breast cancer ([place]) 10/31/2016   patient was notifed today!!  . Hypertension   . Refusal of blood transfusions as patient is Jehovah's Witness     SURGICAL HISTORY: Past Surgical History:  Procedure Laterality Date  . BREAST BIOPSY  10/25/2016  . DILATION AND CURETTAGE OF UTERUS      SOCIAL HISTORY: Social History   Social History  . Marital status: Single    Spouse name: N/A  . Number of children: N/A  . Years of education: N/A   Occupational History  . Not on  file.   Social History Main Topics  . Smoking status: Never Smoker  . Smokeless tobacco: Never Used  . Alcohol use Yes  . Drug use: No  . Sexual activity: Not on file   Other Topics Concern  . Not on file   Social History Narrative  . No narrative on file    FAMILY HISTORY: Family History  Problem Relation Age of Onset  . Breast cancer Neg Hx   . Colon cancer Neg Hx     ALLERGIES:  is allergic to other.  MEDICATIONS:  Current Outpatient Prescriptions  Medication Sig Dispense Refill  . OVER THE COUNTER MEDICATION Take 1 tablet by mouth as needed. Sinus relief medication. OTC    . anastrozole (ARIMIDEX) 1 MG tablet Take 1 tablet (1 mg total) by mouth daily. 90 tablet 3   No current facility-administered medications for this visit.     REVIEW OF SYSTEMS:   Constitutional: Denies fevers, chills or abnormal night sweats Eyes: Denies blurriness of vision, double vision or watery eyes Ears, nose, mouth, throat, and face: Denies mucositis or sore throat Respiratory: Denies cough, dyspnea or wheezes Cardiovascular: Denies palpitation, chest discomfort or lower extremity swelling Gastrointestinal:  Denies nausea, heartburn or change in bowel habits Skin: Denies abnormal skin rashes Lymphatics: Denies new lymphadenopathy or easy bruising Neurological:Denies numbness, tingling or new weaknesses Behavioral/Psych: Mood is stable, no new changes  Breast:  Denies any palpable lumps or discharge, Biopsy site changes All other systems were reviewed with the patient and are negative.  PHYSICAL EXAMINATION: ECOG PERFORMANCE STATUS: 1 - Symptomatic but completely  ambulatory  Vitals:   11/09/16 0903  BP: (!) 177/86  Pulse: 80  Resp: 18  Temp: 97.9 F (36.6 C)   Filed Weights   11/09/16 0903  Weight: 256 lb 11.2 oz (116.4 kg)    GENERAL:alert, no distress and comfortable SKIN: skin color, texture, turgor are normal, no rashes or significant lesions EYES: normal, conjunctiva  are pink and non-injected, sclera clear OROPHARYNX:no exudate, no erythema and lips, buccal mucosa, and tongue normal  NECK: supple, thyroid normal size, non-tender, without nodularity LYMPH:  no palpable lymphadenopathy in the cervical, axillary or inguinal LUNGS: clear to auscultation and percussion with normal breathing effort HEART: regular rate & rhythm and no murmurs and no lower extremity edema ABDOMEN:abdomen soft, non-tender and normal bowel sounds Musculoskeletal:no cyanosis of digits and no clubbing  PSYCH: alert & oriented x 3 with fluent speech NEURO: no focal motor/sensory deficits BREAST: Biopsy site changes, No palpable nodules in breast. No palpable axillary or supraclavicular lymphadenopathy (exam performed in the presence of a chaperone)   LABORATORY DATA:  I have reviewed the data as listed Lab Results  Component Value Date   WBC 14.2 (H) 02/24/2015   HGB 12.3 02/24/2015   HCT 39.1 02/24/2015   MCV 93.1 02/24/2015   PLT 180 02/24/2015   Lab Results  Component Value Date   NA 142 02/24/2015   K 4.3 02/24/2015   CL 113 (H) 02/24/2015   CO2 22 02/24/2015    RADIOGRAPHIC STUDIES: I have personally reviewed the radiological reports and agreed with the findings in the report.  ASSESSMENT AND PLAN:  Malignant neoplasm of upper-outer quadrant of right breast in female, estrogen receptor positive ([place]) 10/28/2016: Screening detected right breast distortion at UOQ 10:00: 2.4 cm with abnormal lymph node in the axilla, biopsy invasive lobular cancer with LCIS ER 90%, PR 80%, Ki-67 10%, HER-2 negative ratio 1.48, T2 N0 stage II a Left breast biopsy: Benign  Pathology and radiology counseling:Discussed with the patient, the details of pathology including the type of breast cancer,the clinical staging, the significance of ER, PR and HER-2/neu receptors and the implications for treatment. After reviewing the pathology in detail, we proceeded to discuss the different  treatment options between surgery, radiation, chemotherapy, antiestrogen therapies.  Recommendations:  MRI breast 1. Mammaprint testing to determine if she needs chemotherapy 2. neoadjuvant therapy either with antiestrogen therapy on with chemotherapy depending on Mammaprint 2. breast conserving surgery with targeted lymph node dissection  3. Adjuvant radiation therapy followed by 4. Adjuvant antiestrogen therapy  Mammaprint counseling: MINDACT is a prospective, randomized phase III controlled trial that investigates the clinical utility of MammaPrint, when compared to standard clinical pathological criteria, with 6,693 patients enrolled from over 111 institutions. Clinical high-risk patients with a Low Risk MammaPrint result, including 48% node-positive, had 5-year distant metastasis-free survival rate in excess of 94 percent, whether randomized to receive adjuvant chemotherapy or not proving MammaPrint's ability to safely identify Low Risk patients.  Return to clinic based on the Mammaprint test result. Anastrozole counseling: I sent a prescription for anastrozole 1 mg daily We discussed the risks and benefits of anti-estrogen therapy with aromatase inhibitors. These include but not limited to insomnia, hot flashes, mood changes, vaginal dryness, bone density loss, and weight gain. We strongly believe that the benefits far outweigh the risks. Patient understands these risks and consented to starting treatment.    All questions were answered. The patient knows to call the clinic with any problems, questions or concerns.    Lindi Adie,  Tyler Deis, MD 11/09/16

## 2016-11-09 NOTE — Progress Notes (Signed)
Nutrition Assessment  Reason for Assessment:  Pt seen in Breast Clinic  ASSESSMENT:  66 year old female with new diagnosis of breast cancer.  Past medical history of HTN.   Medications:  reviewed  Labs: reviewed   Anthropometrics:   Height: 65 inches Weight: 256 lb 11.2 oz BMI: 42.8   NUTRITION DIAGNOSIS: Food and nutrition related knowledge deficit related to new diagnosis of breast cancer as evidenced by no prior need for nutrition related information.  INTERVENTION:   Discussed and provided packet of information regarding nutritional tips for breast cancer patients.  Questions answered.  Teachback method used.  Contact information provided and patient knows to contact me with questions/concerns.    MONITORING, EVALUATION, and GOAL: Pt will consume a healthy plant based diet to maintain lean body mass throughout treatment.   Yarisbel Miranda B. Zenia Resides, [place], [place] Registered Dietitian [phone] (pager)

## 2016-11-09 NOTE — Telephone Encounter (Signed)
Ordered mammaprint on core per Dr. Lindi Adie.  Faxed requisition to pathology and [facility] and confirmed receipt.

## 2016-11-09 NOTE — Assessment & Plan Note (Addendum)
10/28/2016: Screening detected right breast distortion at UOQ 10:00: 2.4 cm with abnormal lymph node in the axilla, biopsy invasive lobular cancer with LCIS ER 90%, PR 80%, Ki-67 10%, HER-2 negative ratio 1.48, T2 N0 stage II a Left breast biopsy: Benign  Pathology and radiology counseling:Discussed with the patient, the details of pathology including the type of breast cancer,the clinical staging, the significance of ER, PR and HER-2/neu receptors and the implications for treatment. After reviewing the pathology in detail, we proceeded to discuss the different treatment options between surgery, radiation, chemotherapy, antiestrogen therapies.  Recommendations:  MRI breast 1. Mammaprint testing to determine if she needs chemotherapy 2. neoadjuvant therapy either with antiestrogen therapy on with chemotherapy depending on Mammaprint 2. breast conserving surgery with targeted lymph node dissection  3. Adjuvant radiation therapy followed by 4. Adjuvant antiestrogen therapy  Mammaprint counseling: MINDACT is a prospective, randomized phase III controlled trial that investigates the clinical utility of MammaPrint, when compared to standard clinical pathological criteria, with 6,693 patients enrolled from over 111 institutions. Clinical high-risk patients with a Low Risk MammaPrint result, including 48% node-positive, had 5-year distant metastasis-free survival rate in excess of 94 percent, whether randomized to receive adjuvant chemotherapy or not proving MammaPrint's ability to safely identify Low Risk patients.  Return to clinic based on the Mammaprint test result. Anastrozole counseling: I sent a prescription for anastrozole 1 mg daily We discussed the risks and benefits of anti-estrogen therapy with aromatase inhibitors. These include but not limited to insomnia, hot flashes, mood changes, vaginal dryness, bone density loss, and weight gain. We strongly believe that the benefits far outweigh the  risks. Patient understands these risks and consented to starting treatment.

## 2016-11-09 NOTE — Progress Notes (Signed)
Radiation Oncology         [phone] ________________________________  Initial Outpatient Consultation  Name: Sharon Reid MRN: [id number]  Date: May 21, 202018  DOB: May 19, 1951  CC:Sharon Ebbs, MD  Sharon Klein, MD   REFERRING PHYSICIAN: Stark Klein, MD  DIAGNOSIS: The encounter diagnosis was Malignant neoplasm of upper-outer quadrant of right breast in female, estrogen receptor positive ([place]).  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 female who presented for screening mammogram on 10/18/16 showing possible distortion in the right breast and possible mass in the left breast. The patient had bilateral diagnostic breast mammogram and ultrasound which showed a 2.4 x 1.2 x 1.7 cm mass in the right breast 10:00 position, 1 cm from the nipple, a 1.3 cm lymph node with a moderately thickened cortex in the right axilla, a 1.4 x 1.2 x 1.2 cm mass in the left breast at 2:00 position, 4 cm from the nipple, and no lymphadenopathy in the left axilla.  Multiple biopsies were optained on 10/28/16. Biopsy of the right breast in the 10:00 position, 1 cm from the nipple, revealed grade 2-3 ILC and LCIS (ER 90% +, PR 80% +, HER2 negative, Ki67 10%). Biopsy of a level 1 right axillary lymph node revealed metastatic carcinoma. Biopsy of the left breast in the 2:00 position, 4 cm from the nipple, revealed fibrocystic change including apocrine metaplasia, fat necrosis, and fibrosis ad calcifications. No carcinoma identified.  Gynecologic History Age at first menstrual period? 12 Are you still having periods? No  Approximate date of last period? "age 54-50" If you no longer have periods: Have you used hormone replacement? No Obstetric History:  How many children have you carried to term? 0  Your age at first live birth? N/A Pregnant now or trying to get pregnant? No Have you used birth  control pills or hormone shots for contraception? No If so, for how long (or approximate dates)? N/A Health Maintenance: Have you ever had a colonoscopy? No Have you ever had a bone density? Yes, 5/18 Date of your last PAP smear? 2008  Date of your FIRST mammogram? 5/18  The patient presents today in multidisciplinary breast clinic to discuss treatment options for the management of her disease.  On review of systems, the patient is positive for glasses, sinus problems, dentures, and a breast lump.  PREVIOUS RADIATION THERAPY: No  PAST MEDICAL HISTORY:  has a past medical history of Breast cancer ([place]) (10/31/2016); Hypertension; and Refusal of blood transfusions as patient is Jehovah's Witness.    PAST SURGICAL HISTORY: Past Surgical History:  Procedure Laterality Date  . BREAST BIOPSY  10/25/2016  . DILATION AND CURETTAGE OF UTERUS      FAMILY HISTORY: family history is not on file.  SOCIAL HISTORY:  reports that she has never smoked. She has never used smokeless tobacco. She reports that she drinks alcohol. She reports that she does not use drugs.  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  ALLERGIES: Other  MEDICATIONS:  Current Outpatient Prescriptions  Medication Sig Dispense Refill  . anastrozole (ARIMIDEX) 1 MG tablet Take 1 tablet (1 mg total) by mouth daily. 90 tablet 3  . OVER THE COUNTER MEDICATION Take 1 tablet by mouth as needed. Sinus relief medication. OTC     No current facility-administered medications for this encounter.     REVIEW OF SYSTEMS: A 10+ POINT REVIEW  OF SYSTEMS WAS OBTAINED including neurology, dermatology, psychiatry, cardiac, respiratory, lymph, extremities, GI, GU, musculoskeletal, constitutional, reproductive, HEENT. All pertinent positives are noted in the HPI. All others are negative.   PHYSICAL EXAM:  vitals were not taken for this visit.  Vitals with BMI Sep 18, 202018  Height '5\' 5"'   Weight 256 lbs 11 oz  BMI  32.6  Systolic 712  Diastolic 86  Pulse 80  Respirations 18  General: Alert and oriented, in no acute distress. HEENT: Head is normocephalic. Extraocular movements are intact. Oropharynx is clear. Neck: Neck is supple, no palpable cervical or supraclavicular lymphadenopathy. Heart: Regular in rate and rhythm with no murmurs, rubs, or gallops. Chest: Clear to auscultation bilaterally, with no rhonchi, wheezes, or rales. Abdomen: Soft, nontender, nondistended, with no rigidity or guarding. Extremities: No cyanosis or edema. Lymphatics: see Neck Exam Skin: No concerning lesions. Musculoskeletal: symmetric strength and muscle tone throughout. Neurologic: Cranial nerves II through XII are grossly intact. No obvious focalities. Speech is fluent. Coordination is intact. Psychiatric: Judgment and insight are intact. Affect is appropriate. Breast: Left breast shows no palpable mass or nipple discharge. In the right breast, the patient has bruising in the upper outer quadrant. There is a palpable mass in the 9-10 o'clock position measuring approximately 3.0 x 2.5 cm in size. This appears to be superficial within the breast but with no skin involvement. This is adjacent to the areolar border.  ECOG = 0   LABORATORY DATA:  Lab Results  Component Value Date   WBC 9.2 0Sep 18, 202018   HGB 13.2 0Sep 18, 202018   HCT 40.8 0Sep 18, 202018   MCV 92.9 0Sep 18, 202018   PLT 197 0Sep 18, 202018   NEUTROABS 5.3 0Sep 18, 202018   Lab Results  Component Value Date   NA 142 02/24/2015   K 4.3 02/24/2015   CL 113 (H) 02/24/2015   CO2 22 02/24/2015   GLUCOSE 152 (H) 02/24/2015   CREATININE 0.95 02/24/2015   CALCIUM 9.3 02/24/2015      RADIOGRAPHY: Dg Bone Density (dxa)  Result Date: 10/18/2016 EXAM: DUAL X-RAY ABSORPTIOMETRY (DXA) FOR BONE MINERAL DENSITY IMPRESSION: Referring Physician:  Nolene Reid PATIENT: Name: Sharon Reid Patient ID: [id number] Birth Date: 03-18-1951 Height: 65.5 in. Sex: Female Measured:  10/18/2016 Weight: 241.1 lbs. Indications: Estrogen Deficient, Postmenopausal Fractures: None Treatments: None ASSESSMENT: The BMD measured at Femur Neck Left is 0.934 g/cm2 with a T-score of -0.8. This patient is considered normal according to [facility]) criteria. L-1 was excluded due to degenerative changes. Per the official positions of the ISCD, it is not possible to quantitatively compare BMD or calculate an [facility] between exams done at different facilities. Site Region Measured Date Measured Age YA BMD Significant CHANGE T-score DualFemur Neck Left 10/18/2016    [age]         -0.7    0.934 g/cm2 AP Spine  L2-L4     10[age]18/2016    [age]         0.3     1.249 g/cm2 World Health Organization [facility]) criteria for post-menopausal, Caucasian Women: Normal       T-score at or above -1 SD Osteopenia   T-score between -1 and -2.5 SD Osteoporosis T-score at or below -2.5 SD RECOMMENDATION: [place] recommends that FDA-approved medical therapies be considered in postmenopausal women and men age 110 or older with a: 1. Hip or vertebral (clinical or morphometric) fracture. 2. T-score of <-2.5 at the spine or hip. 3. Ten-year fracture probability by FRAX of 3% or greater  for hip fracture or 20% or greater for major osteoporotic fracture. All treatment decisions require clinical judgment and consideration of individual patient factors, including patient preferences, co-morbidities, previous drug use, risk factors not captured in the FRAX model (e.g. falls, vitamin D deficiency, increased bone turnover, interval significant decline in bone density) and possible under - or over-estimation of fracture risk by FRAX. All patients should ensure an adequate intake of dietary calcium (1200 mg/d) and vitamin D (800 IU daily) unless contraindicated. FOLLOW-UP: People with diagnosed cases of osteoporosis or at high risk for fracture should have regular bone mineral density tests. For patients  eligible for Medicare, routine testing is allowed once every 2 years. The testing frequency can be increased to one year for patients who have rapidly progressing disease, those who are receiving or discontinuing medical therapy to restore bone mass, or have additional risk factors. I have reviewed this report, and agree with the above findings. [facility] Radiology Electronically Signed   By: Rolm Baptise M.D.   On: 10/18/2016 11:17   US Breast Ltd Uni Left Inc Axilla  Result Date: 10/25/2016 CLINICAL DATA:  Screening recall for bilateral breast masses. EXAM: 2D DIGITAL DIAGNOSTIC BILATERAL MAMMOGRAM WITH CAD AND ADJUNCT TOMO BILATERAL BREAST ULTRASOUND COMPARISON:  Baseline mammogram dated 10/18/2016 ACR Breast Density Category c: The breast tissue is heterogeneously dense, which may obscure small masses. FINDINGS: Spot compression CC and MLO tomograms were performed of the bilateral breasts. There is an irregular spiculated mass in the periareolar/slightly outer right breast measuring approximately 2 cm. There is a round circumscribed mass in the outer left breast measuring approximately 1.3 cm. Mammographic images were processed with CAD. Physical examination of the slightly outer/periareolar right breast does not reveal any definite palpable masses. Targeted ultrasound of the right breast was performed demonstrating an irregular hypoechoic mass at 10 o'clock 1 cm from the nipple measuring 2.4 x 1.3 x 1.7 cm. This corresponds well with the mass seen at mammography. A 1.3 cm lymph node with a moderately thickened cortex is seen in the right axilla. Targeted ultrasound of the left breast was performed demonstrating an oval circumscribed hypoechoic mass at 2 o'clock 4 cm from nipple measuring 1.4 x 1.2 x 1.2 cm. This corresponds well with the mass seen at mammography. No lymphadenopathy seen in the left axilla. IMPRESSION: 1. Highly suspicious right breast mass. 2. Suspicious right axillary lymph node. 3.   Indeterminate left breast mass. RECOMMENDATION: 1. Ultrasound-guided biopsy of the suspicious mass in the right breast as well as biopsy of the right axillary lymph node is recommended. 2. Ultrasound-guided biopsy of the oval mass in the outer left breast is recommended. This is scheduled for Friday 10/28/2016 at 2:45 p.m. I have discussed the findings and recommendations with the patient. Results were also provided in writing at the conclusion of the visit. If applicable, a reminder letter will be sent to the patient regarding the next appointment. BI-RADS CATEGORY  5: Highly suggestive of malignancy. Electronically Signed   By: Everlean Alstrom M.D.   On: 10/25/2016 14:32   US Breast Ltd Uni Right Inc Axilla  Result Date: 10/25/2016 CLINICAL DATA:  Screening recall for bilateral breast masses. EXAM: 2D DIGITAL DIAGNOSTIC BILATERAL MAMMOGRAM WITH CAD AND ADJUNCT TOMO BILATERAL BREAST ULTRASOUND COMPARISON:  Baseline mammogram dated 10/18/2016 ACR Breast Density Category c: The breast tissue is heterogeneously dense, which may obscure small masses. FINDINGS: Spot compression CC and MLO tomograms were performed of the bilateral breasts. There is an irregular spiculated mass  in the periareolar/slightly outer right breast measuring approximately 2 cm. There is a round circumscribed mass in the outer left breast measuring approximately 1.3 cm. Mammographic images were processed with CAD. Physical examination of the slightly outer/periareolar right breast does not reveal any definite palpable masses. Targeted ultrasound of the right breast was performed demonstrating an irregular hypoechoic mass at 10 o'clock 1 cm from the nipple measuring 2.4 x 1.3 x 1.7 cm. This corresponds well with the mass seen at mammography. A 1.3 cm lymph node with a moderately thickened cortex is seen in the right axilla. Targeted ultrasound of the left breast was performed demonstrating an oval circumscribed hypoechoic mass at 2 o'clock 4  cm from nipple measuring 1.4 x 1.2 x 1.2 cm. This corresponds well with the mass seen at mammography. No lymphadenopathy seen in the left axilla. IMPRESSION: 1. Highly suspicious right breast mass. 2. Suspicious right axillary lymph node. 3.  Indeterminate left breast mass. RECOMMENDATION: 1. Ultrasound-guided biopsy of the suspicious mass in the right breast as well as biopsy of the right axillary lymph node is recommended. 2. Ultrasound-guided biopsy of the oval mass in the outer left breast is recommended. This is scheduled for Friday 10/28/2016 at 2:45 p.m. I have discussed the findings and recommendations with the patient. Results were also provided in writing at the conclusion of the visit. If applicable, a reminder letter will be sent to the patient regarding the next appointment. BI-RADS CATEGORY  5: Highly suggestive of malignancy. Electronically Signed   By: Everlean Alstrom M.D.   On: 10/25/2016 14:32   Mm Diag Breast Tomo Bilateral  Result Date: 10/25/2016 CLINICAL DATA:  Screening recall for bilateral breast masses. EXAM: 2D DIGITAL DIAGNOSTIC BILATERAL MAMMOGRAM WITH CAD AND ADJUNCT TOMO BILATERAL BREAST ULTRASOUND COMPARISON:  Baseline mammogram dated 10/18/2016 ACR Breast Density Category c: The breast tissue is heterogeneously dense, which may obscure small masses. FINDINGS: Spot compression CC and MLO tomograms were performed of the bilateral breasts. There is an irregular spiculated mass in the periareolar/slightly outer right breast measuring approximately 2 cm. There is a round circumscribed mass in the outer left breast measuring approximately 1.3 cm. Mammographic images were processed with CAD. Physical examination of the slightly outer/periareolar right breast does not reveal any definite palpable masses. Targeted ultrasound of the right breast was performed demonstrating an irregular hypoechoic mass at 10 o'clock 1 cm from the nipple measuring 2.4 x 1.3 x 1.7 cm. This corresponds well  with the mass seen at mammography. A 1.3 cm lymph node with a moderately thickened cortex is seen in the right axilla. Targeted ultrasound of the left breast was performed demonstrating an oval circumscribed hypoechoic mass at 2 o'clock 4 cm from nipple measuring 1.4 x 1.2 x 1.2 cm. This corresponds well with the mass seen at mammography. No lymphadenopathy seen in the left axilla. IMPRESSION: 1. Highly suspicious right breast mass. 2. Suspicious right axillary lymph node. 3.  Indeterminate left breast mass. RECOMMENDATION: 1. Ultrasound-guided biopsy of the suspicious mass in the right breast as well as biopsy of the right axillary lymph node is recommended. 2. Ultrasound-guided biopsy of the oval mass in the outer left breast is recommended. This is scheduled for Friday 10/28/2016 at 2:45 p.m. I have discussed the findings and recommendations with the patient. Results were also provided in writing at the conclusion of the visit. If applicable, a reminder letter will be sent to the patient regarding the next appointment. BI-RADS CATEGORY  5: Highly suggestive of malignancy. Electronically  Signed   By: Everlean Alstrom M.D.   On: 10/25/2016 14:32   Mm Screening Breast Tomo Bilateral  Result Date: 10/18/2016 CLINICAL DATA:  Screening. EXAM: 2D DIGITAL SCREENING BILATERAL MAMMOGRAM WITH CAD AND ADJUNCT TOMO COMPARISON:  None. ACR Breast Density Category c: The breast tissue is heterogeneously dense, which may obscure small masses. FINDINGS: In the right breast possible distortion requires further evaluation. In the left breast possible mass requires further evaluation. Images were processed with CAD. IMPRESSION: Further evaluation is suggested for possible distortion in the right breast. Further evaluation is suggested for possible mass in the left breast. RECOMMENDATION: Diagnostic mammogram and possibly ultrasound of both breasts. (Code:FI-B-35M) The patient will be contacted regarding the findings, and  additional imaging will be scheduled. BI-RADS CATEGORY  0: Incomplete. Need additional imaging evaluation and/or prior mammograms for comparison. Electronically Signed   By: Marin Olp M.D.   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 The left breast showed fibrocystic change including apocrine metaplasia, fat necrosis, fibrosis and calcifications. This was found to be concordant by Dr. Peggye Fothergill. Pathology results were discussed with the patient by telephone. The patient reported doing well after the biopsies with tenderness at the sites. Post biopsy instructions and care were reviewed and questions were answered. The patient was encouraged to call [facility] [place] for any additional concerns. The patient was referred to the [facility] at the [facility] on Nov 09, 2016. Pathology results reported by Susa Raring RN, BSN on 11/01/2016. Electronically Signed   By: Evangeline Dakin M.D.   On: 11/01/2016 07:41   Result Date: 11/01/2016 CLINICAL DATA:  66 year old presenting with screening detected bilateral breast masses. At diagnostic workup, an approximate 2.7 cm mass associated with architectural distortion was identified in the upper outer right breast at the 10 o'clock position approximately 1 cm from the nipple. A solitary pathologic right axillary lymph node was identified with cortical thickening up to 6 mm. A 1.5 cm mass was identified in the upper outer quadrant of the left breast at the 2 o'clock position approximately 4 cm from the nipple. Biopsy of each is performed today. EXAM: ULTRASOUND GUIDED CORE NEEDLE BIOPSY OF A RIGHT AXILLARY NODE COMPARISON:  Previous exam(s).  FINDINGS: I met with the patient and we discussed the procedure of ultrasound-guided biopsy, including benefits and alternatives. We discussed the high likelihood of a successful procedure. We discussed the risks of the procedure, including infection, bleeding, tissue injury, clip migration, and inadequate sampling. Informed written consent was given. The usual time-out protocol was performed immediately prior to the procedure. Using sterile technique with chlorhexidine as skin antisepsis, 1% Lidocaine as local anesthetic, under direct ultrasound visualization, a 14 gauge Bard Marquee core needle device was used to perform biopsy of a solitary pathologic level 1 right axillary lymph node using an inferolateral approach. At the conclusion of the procedure a HydroMark spiral shaped tissue marker clip was deployed into the biopsy cavity. Follow up 2 view mammogram was performed and dictated separately. IMPRESSION: Ultrasound guided biopsy of a solitary pathologic level 1 right axillary lymph node. No apparent complications. Electronically Signed: By: Evangeline Dakin M.D. On: 10/28/2016 16:30   Mm Clip Placement Left  Result Date:  10/28/2016 CLINICAL DATA:  Confirmation of clip placement after ultrasound-guided core needle biopsy of an indeterminate mass in the upper outer quadrant of the left breast which changed morphology during the biopsy procedure but did not completely resolve. EXAM: DIAGNOSTIC LEFT MAMMOGRAM POST ULTRASOUND BIOPSY COMPARISON:  Previous exam(s). FINDINGS: Mammographic images were obtained following ultrasound guided biopsy of a mass in the upper outer quadrant of the left breast. The coil shaped tissue marker clip is appropriately positioned at the site of the biopsied mass. Expected post biopsy changes are present without evidence hematoma. IMPRESSION: Appropriate positioning of the coil shaped tissue marker clip at the site of the biopsied mass in the upper outer quadrant of the left  breast. Final Assessment: Post Procedure Mammograms for Marker Placement Electronically Signed   By: Evangeline Dakin M.D.   On: 10/28/2016 16:34   Mm Clip Placement Right  Result Date: 10/28/2016 CLINICAL DATA:  Confirmation of clip placement after ultrasound-guided core needle biopsy of a suspicious mass associated with architectural distortion in the upper outer quadrant of the right breast and ultrasound-guided core needle biopsy of a solitary pathologic right axillary lymph node. EXAM: DIAGNOSTIC RIGHT MAMMOGRAM POST ULTRASOUND BIOPSY COMPARISON:  Previous exam(s). FINDINGS: Mammographic images were obtained following ultrasound guided biopsy of a suspicious mass in the upper outer quadrant of the right breast and ultrasound-guided biopsy of a solitary pathologic right axillary lymph node. The ribbon shaped tissue marker clip is appropriately positioned within the biopsied mass in the upper outer quadrant right breast at anterior depth. The [facility] spiral shaped tissue marker clip is appropriately positioned within the pathologic right axillary lymph node, visible on the MLO view. Expected post biopsy changes are present at both sites without evidence of hematoma (a hematoma was suspected in the breast immediately after the biopsy, but a hematoma is not visible on mammography). IMPRESSION: 1. Appropriate positioning of the ribbon shaped tissue marker clip at the site of the biopsied mass associated with architectural distortion in the upper outer quadrant of the right breast at anterior depth. 2. Appropriate positioning of the HydroMark spiral shaped tissue marker clip within the pathologic right axillary lymph node. Final Assessment: Post Procedure Mammograms for Marker Placement Electronically Signed   By: Evangeline Dakin M.D.   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 The left breast showed fibrocystic change including apocrine metaplasia, fat necrosis, fibrosis and calcifications. This was found to be concordant by Dr. Peggye Fothergill. Pathology results were discussed with the patient by telephone. The patient reported doing well after the biopsies with tenderness at the sites. Post biopsy instructions and care were reviewed and questions were answered. The patient was encouraged to call [facility] [place] for any additional concerns. The patient was referred to the [place] [facility] at the [facility] on Nov 09, 2016. Pathology results reported by Susa Raring RN, BSN on 11/01/2016. Electronically Signed   By: Evangeline Dakin M.D.   On: 11/01/2016 07:41   Result Date: 11/01/2016 CLINICAL DATA:  66 year old presenting with screening detected bilateral breast masses. At diagnostic workup, an approximate 2.7 cm suspicious mass associated with architectural distortion was identified in the upper outer right breast at  the 10 o'clock position approximately 1 cm from the nipple. A solitary pathologic right axillary lymph node was identified with cortical thickening up to 6 mm. An indeterminate 1.5 cm mass was identified in the upper outer quadrant of the left breast at the 2 o'clock position approximately 4 cm from the nipple. Biopsy of each is performed today. EXAM: ULTRASOUND GUIDED LEFT BREAST CORE NEEDLE BIOPSY COMPARISON:  Previous exam(s). FINDINGS: I met with the patient and we discussed the procedure of ultrasound-guided biopsy, including benefits and alternatives. We discussed the high likelihood of a successful procedure. We discussed the risks of the procedure, including infection, bleeding, tissue injury, clip migration, and inadequate sampling. Informed written  consent was given. The usual time-out protocol was performed immediately prior to the procedure. Lesion quadrant: Upper outer quadrant. Using sterile technique with chlorhexidine as skin antisepsis, 1% Lidocaine as local anesthetic, under direct ultrasound visualization, a 12 gauge Bard Marquee core needle device was used to perform biopsy of the mass in the upper outer quadrant of the left breast using a lateral approach. The mass changed morphology during during the biopsy procedure but did not completely resolve. The more solid appearing component of the mass was sampled. At the conclusion of the procedure a coil shaped tissue marker clip was deployed into the biopsy cavity. Follow up 2 view mammogram was performed and dictated separately. IMPRESSION: Ultrasound guided biopsy of a mass in the upper outer quadrant of the left breast that changed morphology during the biopsy procedure but did not completely resolve. No apparent complications. Electronically Signed: By: Evangeline Dakin M.D. 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 The left breast showed fibrocystic change including apocrine metaplasia, fat necrosis, fibrosis and calcifications. This was found to be concordant by Dr. Peggye Fothergill. Pathology results were discussed with the patient by telephone. The patient reported doing well after the biopsies with tenderness at the sites. Post biopsy instructions and care were reviewed and questions were answered. The patient was encouraged to call [facility] for any additional concerns. The patient was referred to the [facility] at the [facility] on Nov 09, 2016. Pathology results reported by Susa Raring RN, BSN on 11/01/2016. Electronically Signed   By: Evangeline Dakin M.D.   On: 11/01/2016 07:42   Result Date: 11/01/2016 CLINICAL DATA:  67 year old presenting with screening detected bilateral breast masses. At diagnostic workup, an approximate 2.7 cm mass associated with architectural distortion was identified in the upper outer right quadrant of the breast at the 10 o'clock position approximately 1 cm from the nipple. A solitary pathologic right axillary lymph node was identified with cortical thickening up to 6 mm. A 1.5 cm mass was identified in the upper outer quadrant of the left breast at the 2 o'clock position approximately 4 cm from the nipple. Biopsy of each is performed today. EXAM: ULTRASOUND GUIDED RIGHT BREAST CORE NEEDLE BIOPSY COMPARISON:  Previous exam(s). FINDINGS: I met with the patient and we discussed the procedure of ultrasound-guided biopsy, including benefits and alternatives. We discussed the high likelihood of a successful procedure. We discussed the risks of the procedure, including infection, bleeding, tissue injury, clip migration, and inadequate  sampling. Informed written consent was given. The usual time-out protocol was performed immediately prior to the procedure. Lesion quadrant: Upper outer quadrant. Using sterile technique with chlorhexidine as skin antisepsis, 1% Lidocaine as local anesthetic, under direct ultrasound visualization, a 12 gauge Bard Marquee core needle device was used to perform biopsy of the suspicious mass in the upper outer quadrant of the right breast using a lateral approach. At the conclusion of the procedure a ribbon shaped tissue marker clip was deployed into the biopsy cavity. Follow up 2 view mammogram was performed and dictated separately. IMPRESSION: Ultrasound guided biopsy of a suspicious mass in the upper outer quadrant of the right breast. An approximate 2-3 cm palpable hematoma was present at the  biopsy site post procedure. Electronically Signed: By: Evangeline Dakin M.D. On: 10/28/2016 16:28      IMPRESSION: Stage IIB (cT2N1M0) grade 2-3 invasive lobular carcinoma of the UOQ right breast (ER+,PR+,HER2-).  Patient would appear to be a good candidate for breast conservation therapy and she does wish to proceed with this approach if possible. Radiation therapy would be an interval part of her breast conservation therapy.    PLAN:  Patient will undergo MRI given diagnosis of lobular carcinoma. She will also undergo mammaprint on the core to determine if she would be a candidate for adjuvant chemotherapy. In the meantime the patient will be placed on neoadjuvant aromatase inhibitor. She will then proceed with right lumpectomy and targeted axillary lymph node disection, followed by radiation therapy as breast conservation therapy. The patient will most likely have coverage of her axillary region at the time of her radiation to the breast. Patient will then proceed with aromatase inhibitor for approximately 5 years.  I spent 30 minutes face to face with the patient and more than 50% of that time was spent in counseling and/or coordination of care.   ------------------------------------------------  [name], PhD, MD  This document serves as a record of services personally performed by Gery Pray, MD. It was created on his behalf by Maryla Morrow, a trained medical scribe. The creation of this record is based on the scribe's personal observations and the provider's statements to them. This document has been checked and approved by the attending provider.

## 2016-11-09 NOTE — Progress Notes (Signed)
Clinical Social Work [place] Psychosocial Distress Screening [place]  Patient completed distress screening protocol and scored a 5 on the Psychosocial Distress Thermometer which indicates moderate distress. Clinical Social Worker met with patient in [facility] to assess for distress and other psychosocial needs. Patient stated she was feeling overwhelmed but felt "better" after meeting with the treatment team and getting more information on her treatment plan. CSW and patient discussed common feeling and emotions when being diagnosed with cancer, and the importance of support during treatment. CSW informed patient of the support team and support services at [facility]. CSW provided contact information and encouraged patient to call with any questions or concerns.  ONCBCN DISTRESS SCREENING 02/24/202018  Screening Type Initial Screening  Distress experienced in past week (1-10) 5  Emotional problem type Nervousness/Anxiety  Information Concerns Type Lack of info about diagnosis;Lack of info about treatment;Lack of info about complementary therapy choices  Physical Problem type Sleep/insomnia  Physician notified of physical symptoms Yes     Johnnye Lana, MSW, LCSW, OSW-C Clinical Social Worker [place] [phone]

## 2016-11-09 NOTE — Therapy (Signed)
[place], [place], [address] Phone: [phone]   Fax:  [phone]  Physical Therapy Evaluation  Patient Details  Name: Sharon Reid MRN: [id number] Date of Birth: Apr 16, 1951 Referring Provider: Dr. Stark Klein  Encounter Date: 08/10/202018      PT End of Session - 11/09/16 1529    Visit Number 1   Number of Visits 1   PT Start Time 6962   PT Stop Time 1115   PT Time Calculation (min) 22 min   Activity Tolerance Patient tolerated treatment well   Behavior During Therapy [facility] for tasks assessed/performed      Past Medical History:  Diagnosis Date  . Breast cancer ([place]) 10/31/2016   patient was notifed today!!  . Hypertension   . Refusal of blood transfusions as patient is Jehovah's Witness     Past Surgical History:  Procedure Laterality Date  . BREAST BIOPSY  10/25/2016  . DILATION AND CURETTAGE OF UTERUS      There were no vitals filed for this visit.       Subjective Assessment - 11/09/16 1522    Subjective Patient reports she is here to be seen by her medical team for her newly diagnosed right breast cancer.   Patient is accompained by: Family member   Pertinent History Patient was diagnosed on 10/18/16 with right grade 2 invasive lobular carcinoma breast cancer. It measures 2.4 cm and is located in the upper outer quadrant. It is ER/PR positive and HER2 negative with a Ki67 of 10%. She has a known positive axillary lymph node. She has no other medical problems.   Patient Stated Goals Reduce lymphedema risk and learn post op shoulder ROM HEP   Currently in Pain? No/denies            [facility] PT Assessment - 11/09/16 0001      Assessment   Medical Diagnosis Right breast cancer   Referring Provider Dr. Stark Klein   Onset Date/Surgical Date 10/18/16   Hand Dominance Right   Prior Therapy none     Precautions   Precautions Other (comment)   Precaution Comments active cancer      Restrictions   Weight Bearing Restrictions No     Balance Screen   Has the patient fallen in the past 6 months No   Has the patient had a decrease in activity level because of a fear of falling?  No   Is the patient reluctant to leave their home because of a fear of falling?  No     Home Environment   Living Environment Private residence   Living Arrangements Alone   Available Help at Discharge Friend(s)     Prior Function   Level of Hilda Retired   Leisure She does alot of gardening     Cognition   Overall Cognitive Status Within Functional Limits for tasks assessed     Posture/Postural Control   Posture/Postural Control Postural limitations   Postural Limitations Rounded Shoulders;Forward head     ROM / Strength   AROM / PROM / Strength AROM;Strength     AROM   AROM Assessment Site Shoulder;Cervical   Right/Left Shoulder Right;Left   Right Shoulder Extension 57 Degrees   Right Shoulder Flexion 140 Degrees   Right Shoulder ABduction 127 Degrees  With significant tightness   Right Shoulder Internal Rotation 64 Degrees   Right Shoulder External Rotation 70 Degrees   Left Shoulder Extension 57 Degrees  Left Shoulder Flexion 141 Degrees   Left Shoulder ABduction 136 Degrees   Left Shoulder Internal Rotation 55 Degrees   Left Shoulder External Rotation 71 Degrees   Cervical Flexion WNL   Cervical Extension WNL   Cervical - Right Side Bend WNL   Cervical - Left Side Bend WNL   Cervical - Right Rotation WNL   Cervical - Left Rotation WNL     Strength   Overall Strength Within functional limits for tasks performed           LYMPHEDEMA/ONCOLOGY QUESTIONNAIRE - 11/09/16 1528      Type   Cancer Type Right breast cancer     Lymphedema Assessments   Lymphedema Assessments Upper extremities     Right Upper Extremity Lymphedema   10 cm Proximal to Olecranon Process 37.3 cm   Olecranon Process 29.4 cm   10 cm Proximal to Ulnar  Styloid Process 29.5 cm   Just Proximal to Ulnar Styloid Process 18.3 cm   Across Hand at [employer] 20.8 cm   At [place] of 2nd Digit 7.1 cm     Left Upper Extremity Lymphedema   10 cm Proximal to Olecranon Process 35.2 cm   Olecranon Process 29.4 cm   10 cm Proximal to Ulnar Styloid Process 27.5 cm   Just Proximal to Ulnar Styloid Process 18 cm   Across Hand at [employer] 20.3 cm   At [place] of 2nd Digit 7 cm         Objective measurements completed on examination: See above findings.          Patient was instructed today in a home exercise program today for post op shoulder range of motion. These included active assist shoulder flexion in sitting, scapular retraction, wall walking with shoulder abduction, and hands behind head external rotation.  She was encouraged to do these twice a day, holding 3 seconds and repeating 5 times when permitted by her physician.            PT Education - 11/09/16 1529    Education provided Yes   Education Details Lymphedema risk reduction and post op shoulder ROM HEP   Person(s) Educated Patient   Methods Explanation;Demonstration;Handout   Comprehension Verbalized understanding;Returned demonstration              Breast Clinic Goals - 11/09/16 1532      Patient will be able to verbalize understanding of pertinent lymphedema risk reduction practices relevant to her diagnosis specifically related to skin care.   Time 1   Period Days   Status Achieved     Patient will be able to return demonstrate and/or verbalize understanding of the post-op home exercise program related to regaining shoulder range of motion.   Time 1   Period Days   Status Achieved     Patient will be able to verbalize understanding of the importance of attending the postoperative After Breast Cancer Class for further lymphedema risk reduction education and therapeutic exercise.   Time 1   Period Days   Status Achieved                Plan - 11/09/16 1529    Clinical Impression Statement Patient was diagnosed on 10/18/16 with right grade 2 invasive lobular carcinoma breast cancer. It measures 2.4 cm and is located in the upper outer quadrant. It is ER/PR positive and HER2 negative with a Ki67 of 10%. She has a known positive axillary lymph node. She has  no other medical problems. Her multidisciplinary medical team met prior to her assessments to determine a recommended treatment plan. She is planning to have an MRI, neoadjuvant hormones or chemotherapy, a right lumpectomy with a targeted node dissection, radiation and anti-estrogen therapy. She will benefit from post op PT to regain shoulder ROM and reduce lymphedema risk.   History and Personal Factors relevant to plan of care: Lives alone   Clinical Presentation Evolving   Clinical Presentation due to: Unknown MRI results which could change treatment plan or prognosis   Clinical Decision Making Low   Rehab Potential Excellent   Clinical Impairments Affecting Rehab Potential None   PT Frequency One time visit   PT Treatment/Interventions Patient/family education;Therapeutic exercise   PT Next Visit Plan Will f/u after surgery to determine PT needs   PT Home Exercise Plan Post op shoulder ROM HEP   Consulted and Agree with Plan of Care Patient      Patient will benefit from skilled therapeutic intervention in order to improve the following deficits and impairments:  Postural dysfunction, Decreased range of motion, Impaired UE functional use, Decreased knowledge of precautions  Visit Diagnosis: Abnormal posture - Plan: PT plan of care cert/re-cert  Carcinoma of upper-outer quadrant of right breast in female, estrogen receptor positive ([place]) - Plan: PT plan of care cert/re-cert  Patient will follow up at outpatient cancer rehab if needed following surgery.  If the patient requires physical therapy at that time, a specific plan will be dictated and sent to the referring  physician for approval. The patient was educated today on appropriate basic range of motion exercises to begin post operatively and the importance of attending the After Breast Cancer class following surgery.  Patient was educated today on lymphedema risk reduction practices as it pertains to recommendations that will benefit the patient immediately following surgery.  She verbalized good understanding.  No additional physical therapy is indicated at this time.        G-Codes - 12-02-2016 1533    Functional Assessment Tool Used (Outpatient Only) Clinical Judgement   Functional Limitation Other PT primary   Other PT Primary Current Status (W4462) At least 1 percent but less than 20 percent impaired, limited or restricted   Other PT Primary Goal Status (M6381) At least 1 percent but less than 20 percent impaired, limited or restricted   Other PT Primary Discharge Status (R7116) At least 1 percent but less than 20 percent impaired, limited or restricted       Problem List Patient Active Problem List   Diagnosis Date Noted  . Malignant neoplasm of upper-outer quadrant of right breast in female, estrogen receptor positive ([place]) 11/03/2016  . Blunt chest trauma 02/24/2015  . Right rib fracture 02/24/2015  . Contusion, chest wall 02/23/2015   Annia Friendly, PT 2016-12-02 3:35 PM   [place], [place], [address] Phone: [phone]   Fax:  [phone]  Name: Sharon Reid MRN: [id number] Date of Birth: 1951/03/09

## 2016-11-10 ENCOUNTER — Other Ambulatory Visit: Payer: Self-pay | Admitting: General Surgery

## 2016-11-15 ENCOUNTER — Encounter: Payer: Medicare Other | Admitting: Gastroenterology

## 2016-11-15 ENCOUNTER — Telehealth: Payer: Self-pay | Admitting: *Deleted

## 2016-11-15 NOTE — Telephone Encounter (Signed)
Received Mammaprint results of - LOW RISK- physician team notified Spoke with pt and gave results. Denies questions or concerns regarding dx or treatment care plan from [facility] on 5.30.18. Encourage pt to call with needs. Received verbal understanding. Discussed MRI on 6/11.

## 2016-11-21 ENCOUNTER — Other Ambulatory Visit: Payer: Self-pay | Admitting: General Surgery

## 2016-11-21 ENCOUNTER — Ambulatory Visit (HOSPITAL_COMMUNITY)
Admission: RE | Admit: 2016-11-21 | Discharge: 2016-11-21 | Disposition: A | Discharge status: Home / Self Care | Payer: Medicare Other | Source: Ambulatory Visit | Attending: General Surgery | Admitting: General Surgery

## 2016-11-21 DIAGNOSIS — C50211 Malignant neoplasm of upper-inner quadrant of right female breast: Secondary | ICD-10-CM

## 2016-11-21 DIAGNOSIS — C50419 Malignant neoplasm of upper-outer quadrant of unspecified female breast: Secondary | ICD-10-CM

## 2016-11-21 DIAGNOSIS — Z170 Estrogen receptor positive status [ER+]: Principal | ICD-10-CM

## 2016-11-21 MED ORDER — GADOBENATE DIMEGLUMINE 529 MG/ML IV SOLN: 20.0000 mL | Freq: Once | INTRAVENOUS | Status: DC | PRN

## 2016-11-23 ENCOUNTER — Encounter (HOSPITAL_COMMUNITY): Payer: Self-pay

## 2016-11-28 ENCOUNTER — Ambulatory Visit
Admission: RE | Admit: 2016-11-28 | Discharge: 2016-11-28 | Disposition: A | Discharge status: Home / Self Care | Payer: Medicare Other | Source: Ambulatory Visit | Attending: General Surgery | Admitting: General Surgery

## 2016-11-28 DIAGNOSIS — C50419 Malignant neoplasm of upper-outer quadrant of unspecified female breast: Secondary | ICD-10-CM

## 2016-11-28 MED ORDER — GADOBENATE DIMEGLUMINE 529 MG/ML IV SOLN
18.0000 mL | Freq: Once | INTRAVENOUS | Status: AC | PRN
  Administered 2016-11-28: 18 mL via INTRAVENOUS

## 2016-11-29 ENCOUNTER — Telehealth: Payer: Self-pay | Admitting: General Surgery

## 2016-11-29 ENCOUNTER — Encounter: Payer: Medicare Other | Admitting: Gastroenterology

## 2016-11-29 NOTE — Telephone Encounter (Signed)
Called home and cell number.  No response.  Left message on cell phone VM that she needs another biopsy.

## 2016-12-01 ENCOUNTER — Other Ambulatory Visit: Payer: Self-pay | Admitting: General Surgery

## 2016-12-02 ENCOUNTER — Other Ambulatory Visit: Payer: Self-pay | Admitting: General Surgery

## 2016-12-02 DIAGNOSIS — R928 Other abnormal and inconclusive findings on diagnostic imaging of breast: Secondary | ICD-10-CM

## 2016-12-16 ENCOUNTER — Ambulatory Visit
Admission: RE | Admit: 2016-12-16 | Discharge: 2016-12-16 | Disposition: A | Discharge status: Home / Self Care | Payer: Medicare Other | Source: Ambulatory Visit | Attending: General Surgery | Admitting: General Surgery

## 2016-12-16 ENCOUNTER — Other Ambulatory Visit: Payer: Self-pay | Admitting: General Surgery

## 2016-12-16 DIAGNOSIS — R928 Other abnormal and inconclusive findings on diagnostic imaging of breast: Secondary | ICD-10-CM

## 2016-12-16 MED ORDER — GADOBENATE DIMEGLUMINE 529 MG/ML IV SOLN
20.0000 mL | Freq: Once | INTRAVENOUS | Status: AC | PRN
  Administered 2016-12-16: 20 mL via INTRAVENOUS

## 2017-01-03 ENCOUNTER — Other Ambulatory Visit: Payer: Self-pay | Admitting: General Surgery

## 2017-01-03 DIAGNOSIS — C50411 Malignant neoplasm of upper-outer quadrant of right female breast: Secondary | ICD-10-CM

## 2017-01-03 DIAGNOSIS — C50812 Malignant neoplasm of overlapping sites of left female breast: Secondary | ICD-10-CM

## 2017-01-11 ENCOUNTER — Telehealth: Payer: Self-pay | Admitting: Hematology and Oncology

## 2017-01-11 NOTE — Telephone Encounter (Signed)
sw pt to confirm 8/21 appt at 245 per sch msg

## 2017-01-17 NOTE — Pre-Procedure Instructions (Signed)
Sharon Reid  01/17/2017      [facility] [place] [address] [place] [address] Phone: [phone] Fax: [phone]    Your procedure is scheduled on Tuesday, August 14.  Report to [facility] Admitting at 8:30 AM                 Your surgery or procedure is scheduled for 10:30 AM   Call this number if you have problems the morning of surgery: [phone]- pre op desk     For any other questions, please call [phone], Monday - Friday 8 AM - 4 PM.    Remember:  Do not eat food or drink liquids after midnight Monday, August 13.  Take these medicines the morning of surgery with A SIP OF WATER : May take Tylenol if needed.                    1 Week prior to surgery STOP taking Aspirin, Aspirin Products (Goody Powder, Excedrin Migraine), Ibuprofen (Advil), Naproxen (Aleve), Vitamins and Herbal Products (ie Fish Oil) Special instructions:   [place]- Preparing For Surgery  Before surgery, you can play an important role. Because skin is not sterile, your skin needs to be as free of germs as possible. You can reduce the number of germs on your skin by washing with CHG (chlorahexidine gluconate) Soap before surgery.  CHG is an antiseptic cleaner which kills germs and bonds with the skin to continue killing germs even after washing.  Please do not use if you have an allergy to CHG or antibacterial soaps. If your skin becomes reddened/irritated stop using the CHG.  Do not shave (including legs and underarms) for at least 48 hours prior to first CHG shower. It is OK to shave your face.  Please follow these instructions carefully.   1. Shower the NIGHT BEFORE SURGERY and the MORNING OF SURGERY with CHG.   2. If you chose to wash your hair, wash your hair first as usual with your normal shampoo.  3. After you shampoo, rinse your hair and body thoroughly to remove the shampoo.   Wash  your face and private area with the soap you use at home, then rinse.  4. Use CHG as you would any other liquid soap. You can apply CHG directly to the skin and wash gently with a scrungie or a clean washcloth.   5. Apply the CHG Soap to your body ONLY FROM THE NECK DOWN.  Do not use on open wounds or open sores. Avoid contact with your eyes, ears, mouth and genitals (private parts). Wash genitals (private parts) with your normal soap.  6. Wash thoroughly, paying special attention to the area where your surgery will be performed.  7. Thoroughly rinse your body with warm water from the neck down.  8. DO NOT shower/wash with your normal soap after using and rinsing off the CHG Soap.  9. Pat yourself dry with a CLEAN TOWEL.   10. Wear CLEAN PAJAMAS   11. Place CLEAN SHEETS on your bed the night of your first shower and DO NOT SLEEP WITH PETS. Day of Surgery: shower same as above Do not apply any deodorants/lotions, powders or cologne. Please wear clean clothes to the hospital/surgery center.    Do not wear jewelry, make-up or nail polish.  Do not shave 48 hours prior to  surgery.  Men may shave face and neck.  Do not bring valuables to the hospital.  [facility] is not responsible for any belongings or valuables.  Contacts, dentures or bridgework may not be worn into surgery.  Leave your suitcase in the car.  After surgery it may be brought to your room.  For patients admitted to the hospital, discharge time will be determined by your treatment team.  Patients discharged the day of surgery will not be allowed to drive home.   Name and phone number of your driver:     Please read over the following fact sheets that you were given:  Coughing and Deep Breathing, Pain Booklet, Surgical Site Infections.

## 2017-01-18 ENCOUNTER — Encounter (HOSPITAL_COMMUNITY): Payer: Self-pay

## 2017-01-18 ENCOUNTER — Encounter (HOSPITAL_COMMUNITY)
Admission: RE | Admit: 2017-01-18 | Discharge: 2017-01-18 | Disposition: A | Discharge status: Home / Self Care | Payer: Medicare Other | Source: Ambulatory Visit | Attending: General Surgery | Admitting: General Surgery

## 2017-01-18 DIAGNOSIS — C50911 Malignant neoplasm of unspecified site of right female breast: Secondary | ICD-10-CM | POA: Insufficient documentation

## 2017-01-18 DIAGNOSIS — Z01812 Encounter for preprocedural laboratory examination: Secondary | ICD-10-CM | POA: Insufficient documentation

## 2017-01-18 DIAGNOSIS — C50912 Malignant neoplasm of unspecified site of left female breast: Secondary | ICD-10-CM | POA: Insufficient documentation

## 2017-01-18 DIAGNOSIS — Z01818 Encounter for other preprocedural examination: Secondary | ICD-10-CM | POA: Diagnosis present

## 2017-01-18 HISTORY — DX: Gastro-esophageal reflux disease without esophagitis: K21.9

## 2017-01-18 HISTORY — DX: Fracture of one rib, unspecified side, initial encounter for closed fracture: S22.39XA

## 2017-01-18 LAB — CBC
HEMATOCRIT: 39.3 % (ref 36.0–46.0)
HEMOGLOBIN: 12.6 g/dL (ref 12.0–15.0)
MCH: 29 pg (ref 26.0–34.0)
MCHC: 32.1 g/dL (ref 30.0–36.0)
MCV: 90.3 fL (ref 78.0–100.0)
Platelets: 200 10*3/uL (ref 150–400)
RBC: 4.35 MIL/uL (ref 3.87–5.11)
RDW: 12.1 % (ref 11.5–15.5)
WBC: 9.4 10*3/uL (ref 4.0–10.5)

## 2017-01-18 LAB — BASIC METABOLIC PANEL
ANION GAP: 8 (ref 5–15)
BUN: 10 mg/dL (ref 6–20)
CO2: 23 mmol/L (ref 22–32)
Calcium: 9.9 mg/dL (ref 8.9–10.3)
Chloride: 107 mmol/L (ref 101–111)
Creatinine, Ser: 0.78 mg/dL (ref 0.44–1.00)
GFR calc Af Amer: >60 mL/min (ref >60)
GLUCOSE: 94 mg/dL (ref 65–99)
POTASSIUM: 3.8 mmol/L (ref 3.5–5.1)
Sodium: 138 mmol/L (ref 135–145)

## 2017-01-18 LAB — NO BLOOD PRODUCTS

## 2017-01-18 NOTE — Progress Notes (Signed)
PCP: Dr. Nolene Ebbs

## 2017-01-23 ENCOUNTER — Ambulatory Visit
Admission: RE | Admit: 2017-01-23 | Discharge: 2017-01-23 | Disposition: A | Discharge status: Home / Self Care | Payer: Medicare Other | Source: Ambulatory Visit | Attending: General Surgery | Admitting: General Surgery

## 2017-01-23 ENCOUNTER — Other Ambulatory Visit: Payer: Self-pay | Admitting: General Surgery

## 2017-01-23 DIAGNOSIS — C50411 Malignant neoplasm of upper-outer quadrant of right female breast: Secondary | ICD-10-CM

## 2017-01-23 DIAGNOSIS — C50812 Malignant neoplasm of overlapping sites of left female breast: Secondary | ICD-10-CM

## 2017-01-23 MED ORDER — DEXTROSE 5 % IV SOLN
3.0000 g | INTRAVENOUS | Status: AC
  Administered 2017-01-24: 3 g via INTRAVENOUS
  Filled 2017-01-23: qty 3

## 2017-01-23 MED ORDER — GABAPENTIN 300 MG PO CAPS
300.0000 mg | ORAL_CAPSULE | ORAL | Status: AC
  Administered 2017-01-24: 300 mg via ORAL
  Filled 2017-01-23: qty 1

## 2017-01-23 MED ORDER — ACETAMINOPHEN 500 MG PO TABS
1000.0000 mg | ORAL_TABLET | ORAL | Status: AC
  Administered 2017-01-24: 1000 mg via ORAL
  Filled 2017-01-23: qty 2

## 2017-01-23 MED ORDER — CELECOXIB 200 MG PO CAPS
400.0000 mg | ORAL_CAPSULE | ORAL | Status: AC
  Administered 2017-01-24: 400 mg via ORAL
  Filled 2017-01-23: qty 2

## 2017-01-23 NOTE — H&P (Signed)
Sharon Reid 01/03/2017 2:48 PM Location: [facility] Patient #: [id number] DOB: 10-Feb-1951 Single / Language: Cleophus Molt / Race: Black or African American Female   History of Present Illness Stark Klein MD; 01/03/2017 3:53 PM) The patient is a 66 year old female who presents for a follow-up for Breast cancer. Pt is a 66 yo F diagnosed with cT2N1 right breast cancer in May 2018 She presented with screening detected distortion and mass. She had not had mammograms before. She did not palpate any masses. Imaging showed a 2.4 cm mass in the upper outer quadrant of the right breast and a concerning LN. Core needle biopsy showed Grade 2 invasive lobular carcinoma, ER/PR + and her 2 negative, Ki67 10%. Breast density is category C. She had a concerning lesion of the left that was benign and concordant.  She underwent MRI due to the lobular nature of the breast cancer. She was found to have abnormality on the left side. This was biopsied in the anterior and posterior aspects and was positive for DCIS. Total area of abnormality was 6-7 cm. She is here to discuss treatment options.   MRI FINDINGS: Breast composition: c. Heterogeneous fibroglandular tissue.  Background parenchymal enhancement: Mild to moderate  Right breast: In the retroareolar upper outer quadrant of the right breast is an enhancing irregular mass with central biopsy clip artifact. The mass measures 1.7 x 1.0 x 1.5 cm on MRI and corresponds to the biopsy-proven malignancy. No additional suspicious areas of enhancement are identified in the right breast. No residual post biopsy hematoma is seen.  Left breast: There is clumped linear enhancement in the upper-outer quadrant of the left breast, anterior to middle thirds, that spans approximately 4 cm anterior to posterior diameter. Malignancy cannot be excluded.  Biopsy clip artifact is noted in the outer left breast at the site of the prior benign  biopsy.  Lymph nodes: A right axillary level 1 lymph node with an overall normal size, but slight cortical thickening contains biopsy clip artifact and corresponds to the biopsy proven metastatic right axillary lymph node. No additional suspicious lymph nodes are seen in either axilla.  Ancillary findings: None.  IMPRESSION: 1. Biopsy proven malignancy in the retroareolar upper outer quadrant of the right breast measures 1.7 x 1.0 x 1.5 cm and contains central clip artifact. No additional suspicious areas of enhancement in the right breast 2. Cortical thickening of a single right axillary lymph node, previously biopsied, with metastasis demonstrated at pathology. 3. Suspicious clumped linear enhancement in the upper-outer quadrant of the LEFT breast.  Path on left breast 12/16/2016 Diagnosis 1. Breast, left, needle core biopsy, anterior - DUCTAL CARCINOMA IN SITU. - SEE COMMENT. 2. Breast, left, needle core biopsy, posterior - DUCTAL CARCINOMA IN SITU WITH CALCIFICATIONS. - SEE COMMENT. ER/PR negative Microscopic Comment 1. & 2. The carcinoma in the two specimens is morphologically similar and appears high grade.    Allergies Malachy Moan, [place]; 01/03/2017 2:49 PM) No Known Allergies 01/03/2017  Medication History Malachy Moan, [place]; 01/03/2017 2:49 PM) AmLODIPine Besylate (5MG Tablet, Oral) Active. Anastrozole (1MG Tablet, Oral) Active. Medications Reconciled    Review of Systems Stark Klein MD; 01/03/2017 3:53 PM) All other systems negative  Vitals Malachy Moan RMA; 01/03/2017 2:49 PM) 01/03/2017 2:49 PM Weight: 254 lb Height: 65in Body Surface Area: 2.19 m Body Mass Index: 42.27 kg/m  Temp.: 98.36F  Pulse: 85 (Regular)  BP: 124/94 (Sitting, Left Arm, Standard)       Physical Exam Stark Klein  MD; 01/03/2017 3:54 PM) General Mental Status-Alert. General Appearance-Consistent with stated age. Hydration-Well  hydrated. Voice-Normal.  Head and Neck Head-normocephalic, atraumatic with no lesions or palpable masses.  Chest and Lung Exam Chest and lung exam reveals -quiet, even and easy respiratory effort with no use of accessory muscles. Inspection Chest Wall - Normal. Back - normal.  Breast Note: no palpable masses, but + hematoma at bx site on left. no LAD     Assessment & Plan Stark Klein MD; 01/03/2017 3:57 PM) PRIMARY CANCER OF UPPER OUTER QUADRANT OF RIGHT FEMALE BREAST (C50.411) Impression: We will plan seed loc lumpectomy and targeted SLN bx. She will need a seed in the right breast and one in the right axilla.  I reviewed surgery with the patient. This breast cancer will be the determining factor for prognosis. She will need radiation on this side due to the positive node.  The surgical procedure was described to the patient. I discussed the incision type and location and that we would need radiology involved on with a wire or seed marker and/or sentinel node.  The risks and benefits of the procedure were described to the patient and she wishes to proceed.  We discussed the risks bleeding, infection, damage to other structures, need for further procedures/surgeries. We discussed the risk of seroma. The patient was advised if the area in the breast in cancer, we may need to go back to surgery for additional tissue to obtain negative margins or for a lymph node biopsy. The patient was advised that these are the most common complications, but that others can occur as well. They were advised against taking aspirin or other anti-inflammatory agents/blood thinners the week before surgery. Current Plans Pt Education - [facility] breast cancer surgery: discussed with patient and provided information. 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 We discussed that it is a bit more complicated and a  larger area. There is a higher chance for positive margins and we may need to do additional surgery on this side.  She understands and wishes to proceed. Because of the size of the DCIS, we will plan for SLN on this side as well. she would also get XRT on the left .  Orders are written in EPIC including two SLN orders and Two seed orders for left breast, and one seed for right breast, one seed for right axilla.    Signed by Stark Klein, MD (01/03/2017 3:58 PM)

## 2017-01-24 ENCOUNTER — Encounter (HOSPITAL_COMMUNITY)
Admission: RE | Admit: 2017-01-24 | Discharge: 2017-01-24 | Disposition: A | Discharge status: Home / Self Care | Payer: Medicare Other | Source: Ambulatory Visit | Attending: General Surgery | Admitting: General Surgery

## 2017-01-24 ENCOUNTER — Encounter (HOSPITAL_COMMUNITY)
Admission: RE | Disposition: A | Discharge status: Home / Self Care | Payer: Self-pay | Source: Ambulatory Visit | Attending: General Surgery

## 2017-01-24 ENCOUNTER — Ambulatory Visit
Admission: RE | Admit: 2017-01-24 | Discharge: 2017-01-24 | Disposition: A | Discharge status: Home / Self Care | Payer: Medicare Other | Source: Ambulatory Visit | Attending: General Surgery | Admitting: General Surgery

## 2017-01-24 ENCOUNTER — Ambulatory Visit (HOSPITAL_COMMUNITY)
Admission: RE | Admit: 2017-01-24 | Discharge: 2017-01-24 | Disposition: A | Discharge status: Home / Self Care | Payer: Medicare Other | Source: Ambulatory Visit | Attending: General Surgery | Admitting: General Surgery

## 2017-01-24 ENCOUNTER — Ambulatory Visit (HOSPITAL_COMMUNITY): Payer: Medicare Other | Admitting: Anesthesiology

## 2017-01-24 ENCOUNTER — Encounter (HOSPITAL_COMMUNITY): Payer: Self-pay | Admitting: Urology

## 2017-01-24 DIAGNOSIS — D0512 Intraductal carcinoma in situ of left breast: Secondary | ICD-10-CM | POA: Diagnosis not present

## 2017-01-24 DIAGNOSIS — C50812 Malignant neoplasm of overlapping sites of left female breast: Secondary | ICD-10-CM

## 2017-01-24 DIAGNOSIS — I10 Essential (primary) hypertension: Secondary | ICD-10-CM | POA: Insufficient documentation

## 2017-01-24 DIAGNOSIS — C50411 Malignant neoplasm of upper-outer quadrant of right female breast: Secondary | ICD-10-CM | POA: Insufficient documentation

## 2017-01-24 DIAGNOSIS — C773 Secondary and unspecified malignant neoplasm of axilla and upper limb lymph nodes: Secondary | ICD-10-CM | POA: Diagnosis not present

## 2017-01-24 DIAGNOSIS — Z170 Estrogen receptor positive status [ER+]: Secondary | ICD-10-CM | POA: Insufficient documentation

## 2017-01-24 DIAGNOSIS — Z79811 Long term (current) use of aromatase inhibitors: Secondary | ICD-10-CM | POA: Diagnosis not present

## 2017-01-24 HISTORY — PX: RADIOACTIVE SEED GUIDED PARTIAL MASTECTOMY/AXILLARY SENTINEL NODE BIOPSY/AXILLARY NODE DISSECTION: SHX6491

## 2017-01-24 SURGERY — RADIOACTIVE SEED GUIDED PARTIAL MASTECTOMY WITH AXILLARY SENTINEL LYMPH NODE BIOPSY AND AXILLARY LYMPH NODE DISSECTION
Anesthesia: General | Site: Breast | Laterality: Bilateral

## 2017-01-24 MED ORDER — SODIUM CHLORIDE 0.9 % IJ SOLN
INTRAVENOUS | Status: DC | PRN
  Administered 2017-01-24: 6 mL

## 2017-01-24 MED ORDER — CHLORHEXIDINE GLUCONATE CLOTH 2 % EX PADS: 6.0000 | MEDICATED_PAD | Freq: Once | CUTANEOUS | Status: DC

## 2017-01-24 MED ORDER — PROPOFOL 10 MG/ML IV BOLUS
INTRAVENOUS | Status: AC
  Filled 2017-01-24: qty 20

## 2017-01-24 MED ORDER — ONDANSETRON HCL 4 MG/2ML IJ SOLN
INTRAMUSCULAR | Status: AC
  Filled 2017-01-24: qty 2

## 2017-01-24 MED ORDER — PHENYLEPHRINE 40 MCG/ML (10ML) SYRINGE FOR IV PUSH (FOR BLOOD PRESSURE SUPPORT)
PREFILLED_SYRINGE | INTRAVENOUS | Status: DC | PRN
  Administered 2017-01-24: 80 ug via INTRAVENOUS

## 2017-01-24 MED ORDER — ONDANSETRON HCL 4 MG/2ML IJ SOLN
INTRAMUSCULAR | Status: DC | PRN
  Administered 2017-01-24: 4 mg via INTRAVENOUS

## 2017-01-24 MED ORDER — PHENYLEPHRINE HCL 10 MG/ML IJ SOLN
INTRAMUSCULAR | Status: DC | PRN
  Administered 2017-01-24: 50 ug/min via INTRAVENOUS

## 2017-01-24 MED ORDER — TECHNETIUM TC 99M SULFUR COLLOID FILTERED
2.0000 | Freq: Once | INTRAVENOUS | Status: AC | PRN
  Administered 2017-01-24: 2 via INTRADERMAL

## 2017-01-24 MED ORDER — SUGAMMADEX SODIUM 200 MG/2ML IV SOLN
INTRAVENOUS | Status: AC
  Filled 2017-01-24: qty 2

## 2017-01-24 MED ORDER — FENTANYL CITRATE (PF) 100 MCG/2ML IJ SOLN
INTRAMUSCULAR | Status: AC
  Administered 2017-01-24: 50 ug via INTRAVENOUS
  Filled 2017-01-24: qty 2

## 2017-01-24 MED ORDER — MIDAZOLAM HCL 2 MG/2ML IJ SOLN
INTRAMUSCULAR | Status: AC
  Filled 2017-01-24: qty 2

## 2017-01-24 MED ORDER — FENTANYL CITRATE (PF) 250 MCG/5ML IJ SOLN
INTRAMUSCULAR | Status: AC
  Filled 2017-01-24: qty 5

## 2017-01-24 MED ORDER — DEXAMETHASONE SODIUM PHOSPHATE 10 MG/ML IJ SOLN
INTRAMUSCULAR | Status: AC
  Filled 2017-01-24: qty 1

## 2017-01-24 MED ORDER — PROPOFOL 10 MG/ML IV BOLUS
INTRAVENOUS | Status: DC | PRN
  Administered 2017-01-24: 130 mg via INTRAVENOUS
  Administered 2017-01-24: 40 mg via INTRAVENOUS
  Administered 2017-01-24: 30 mg via INTRAVENOUS

## 2017-01-24 MED ORDER — OXYCODONE HCL 5 MG PO TABS: 5.0000 mg | ORAL_TABLET | Freq: Four times a day (QID) | ORAL | 0 refills | Status: DC | PRN

## 2017-01-24 MED ORDER — MIDAZOLAM HCL 5 MG/5ML IJ SOLN
INTRAMUSCULAR | Status: DC | PRN
  Administered 2017-01-24: 2 mg via INTRAVENOUS

## 2017-01-24 MED ORDER — DEXAMETHASONE SODIUM PHOSPHATE 10 MG/ML IJ SOLN
INTRAMUSCULAR | Status: DC | PRN
  Administered 2017-01-24: 10 mg via INTRAVENOUS

## 2017-01-24 MED ORDER — FENTANYL CITRATE (PF) 100 MCG/2ML IJ SOLN
50.0000 ug | Freq: Once | INTRAMUSCULAR | Status: AC
  Administered 2017-01-24: 50 ug via INTRAVENOUS

## 2017-01-24 MED ORDER — BUPIVACAINE-EPINEPHRINE (PF) 0.25% -1:200000 IJ SOLN
INTRAMUSCULAR | Status: DC | PRN
  Administered 2017-01-24 (×2): 30 mL

## 2017-01-24 MED ORDER — 0.9 % SODIUM CHLORIDE (POUR BTL) OPTIME
TOPICAL | Status: DC | PRN
  Administered 2017-01-24: 1000 mL

## 2017-01-24 MED ORDER — LACTATED RINGERS IV SOLN
INTRAVENOUS | Status: DC
  Administered 2017-01-24 (×2): via INTRAVENOUS

## 2017-01-24 MED ORDER — MIDAZOLAM HCL 2 MG/2ML IJ SOLN
INTRAMUSCULAR | Status: AC
  Administered 2017-01-24: 2 mg via INTRAVENOUS
  Filled 2017-01-24: qty 2

## 2017-01-24 MED ORDER — LIDOCAINE 2% (20 MG/ML) 5 ML SYRINGE
INTRAMUSCULAR | Status: AC
  Filled 2017-01-24: qty 5

## 2017-01-24 MED ORDER — LIDOCAINE HCL (CARDIAC) 20 MG/ML IV SOLN
INTRAVENOUS | Status: DC | PRN
  Administered 2017-01-24: 60 mg via INTRAVENOUS

## 2017-01-24 MED ORDER — MIDAZOLAM HCL 2 MG/2ML IJ SOLN
2.0000 mg | Freq: Once | INTRAMUSCULAR | Status: AC
  Administered 2017-01-24: 2 mg via INTRAVENOUS

## 2017-01-24 MED ORDER — FENTANYL CITRATE (PF) 100 MCG/2ML IJ SOLN
INTRAMUSCULAR | Status: DC | PRN
  Administered 2017-01-24: 25 ug via INTRAVENOUS
  Administered 2017-01-24: 50 ug via INTRAVENOUS
  Administered 2017-01-24 (×7): 25 ug via INTRAVENOUS

## 2017-01-24 MED ORDER — BUPIVACAINE-EPINEPHRINE 0.25% -1:200000 IJ SOLN
INTRAMUSCULAR | Status: DC | PRN
  Administered 2017-01-24: 20 mL

## 2017-01-24 MED ORDER — ROCURONIUM BROMIDE 10 MG/ML (PF) SYRINGE
PREFILLED_SYRINGE | INTRAVENOUS | Status: AC
  Filled 2017-01-24: qty 5

## 2017-01-24 SURGICAL SUPPLY — 51 items
BINDER BREAST LRG (GAUZE/BANDAGES/DRESSINGS) IMPLANT
BINDER BREAST XLRG (GAUZE/BANDAGES/DRESSINGS) IMPLANT
BLADE PHOTON ILLUMINATED (MISCELLANEOUS) ×2 IMPLANT
BLADE SURG 15 STRL LF DISP TIS (BLADE) ×1 IMPLANT
BLADE SURG 15 STRL SS (BLADE) ×3
BNDG COHESIVE 4X5 TAN STRL (GAUZE/BANDAGES/DRESSINGS) ×3 IMPLANT
CANISTER SUCT 3000ML PPV (MISCELLANEOUS) IMPLANT
CHLORAPREP W/TINT 26ML (MISCELLANEOUS) ×3 IMPLANT
CLIP VESOCCLUDE LG 6/CT (CLIP) ×7 IMPLANT
CLIP VESOCCLUDE MED 6/CT (CLIP) ×5 IMPLANT
CLIP VESOCCLUDE SM WIDE 6/CT (CLIP) ×3 IMPLANT
COVER MAYO STAND STRL (DRAPES) ×3 IMPLANT
COVER PROBE W GEL 5X96 (DRAPES) ×3 IMPLANT
COVER SURGICAL LIGHT HANDLE (MISCELLANEOUS) ×5 IMPLANT
DEVICE DUBIN SPECIMEN MAMMOGRA (MISCELLANEOUS) ×3 IMPLANT
DRAPE CHEST BREAST 15X10 FENES (DRAPES) ×3 IMPLANT
DRAPE UNIVERSAL PACK (DRAPES) ×3 IMPLANT
DRAPE UTILITY XL STRL (DRAPES) ×3 IMPLANT
ELECT CAUTERY BLADE 6.4 (BLADE) ×3 IMPLANT
ELECT COATED BLADE 2.86 ST (ELECTRODE) ×2 IMPLANT
ELECT REM PT RETURN 9FT ADLT (ELECTROSURGICAL) ×3
ELECTRODE REM PT RTRN 9FT ADLT (ELECTROSURGICAL) ×1 IMPLANT
GLOVE BIO SURGEON STRL SZ 6 (GLOVE) ×3 IMPLANT
GLOVE BIOGEL PI IND STRL 6.5 (GLOVE) ×1 IMPLANT
GLOVE BIOGEL PI INDICATOR 6.5 (GLOVE) ×2
GOWN STRL REUS W/ TWL LRG LVL3 (GOWN DISPOSABLE) ×1 IMPLANT
GOWN STRL REUS W/TWL 2XL LVL3 (GOWN DISPOSABLE) ×3 IMPLANT
GOWN STRL REUS W/TWL LRG LVL3 (GOWN DISPOSABLE) ×3
KIT BASIN OR (CUSTOM PROCEDURE TRAY) ×3 IMPLANT
KIT MARKER MARGIN INK (KITS) ×3 IMPLANT
LIGHT WAVEGUIDE WIDE FLAT (MISCELLANEOUS) ×2 IMPLANT
NDL FILTER BLUNT 18X1 1/2 (NEEDLE) IMPLANT
NDL HYPO 25GX1X1/2 BEV (NEEDLE) ×1 IMPLANT
NEEDLE FILTER BLUNT 18X 1/2SAF (NEEDLE) ×2
NEEDLE FILTER BLUNT 18X1 1/2 (NEEDLE) ×1 IMPLANT
NEEDLE HYPO 25GX1X1/2 BEV (NEEDLE) ×6 IMPLANT
NS IRRIG 1000ML POUR BTL (IV SOLUTION) IMPLANT
PACK GENERAL/GYN (CUSTOM PROCEDURE TRAY) ×3 IMPLANT
STOCKINETTE IMPERVIOUS 9X36 MD (GAUZE/BANDAGES/DRESSINGS) ×1 IMPLANT
SUT ETHILON 2 0 FS 18 (SUTURE) IMPLANT
SUT MNCRL AB 4-0 PS2 18 (SUTURE) ×9 IMPLANT
SUT SILK 2 0 SH (SUTURE) IMPLANT
SUT VIC AB 2-0 SH 27 (SUTURE) ×3
SUT VIC AB 2-0 SH 27XBRD (SUTURE) ×1 IMPLANT
SUT VIC AB 3-0 SH 27 (SUTURE) ×3
SUT VIC AB 3-0 SH 27X BRD (SUTURE) ×1 IMPLANT
SUT VIC AB 3-0 SH 8-18 (SUTURE) ×4 IMPLANT
SYR BULB 3OZ (MISCELLANEOUS) ×3 IMPLANT
SYR CONTROL 10ML LL (SYRINGE) ×3 IMPLANT
TOWEL OR 17X24 6PK STRL BLUE (TOWEL DISPOSABLE) ×3 IMPLANT
TOWEL OR 17X26 10 PK STRL BLUE (TOWEL DISPOSABLE) ×3 IMPLANT

## 2017-01-24 NOTE — Anesthesia Procedure Notes (Signed)
Anesthesia Regional Block: Pectoralis block   Pre-Anesthetic Checklist: ,, timeout performed, Correct Patient, Correct Site, Correct Laterality, Correct Procedure, Correct Position, site marked, Risks and benefits discussed,  Surgical consent,  Pre-op evaluation,  At surgeon's request and post-op pain management  Laterality: Right  Prep: chloraprep       Needles:  Injection technique: Single-shot  Needle Type: Echogenic Needle     Needle Length: 9cm  Needle Gauge: 21     Additional Needles:   Procedures: ultrasound guided,,,,,,,,  Narrative:  Start time: 01/24/2017 10:05 AM End time: 01/24/2017 10:10 AM Injection made incrementally with aspirations every 5 mL.  Performed by: Personally  Anesthesiologist: Suella Broad D  Additional Notes: Tolerated well. No acute issues.

## 2017-01-24 NOTE — Anesthesia Postprocedure Evaluation (Signed)
Anesthesia Post Note  Patient: CHELSIA SERRES  Procedure(s) Performed: Procedure(s) (LRB): RIGHT SEED LOCALIZED LUMPECTOMY AND SEED TARGETED SENTINEL LYMPH NODE BIOPSY, LEFT BREAST SEED BRACKETED LUMPECTOMY AND SENTINEL LYMPH NODE BIOPSY (Bilateral)     Patient location during evaluation: PACU Anesthesia Type: General and Regional Level of consciousness: awake and alert Pain management: pain level controlled Vital Signs Assessment: post-procedure vital signs reviewed and stable Respiratory status: spontaneous breathing, nonlabored ventilation, respiratory function stable and patient connected to nasal cannula oxygen Cardiovascular status: blood pressure returned to baseline and stable Postop Assessment: no signs of nausea or vomiting Anesthetic complications: no    Last Vitals:  Vitals:   01/24/17 1613 01/24/17 1627  BP: (!) 97/57   Pulse: (!) 57   Resp: 13   Temp:  36.7 C  SpO2: 100%     Last Pain:  Vitals:   01/24/17 1627  TempSrc:   PainSc: 0-No pain                 Effie Berkshire

## 2017-01-24 NOTE — Anesthesia Procedure Notes (Signed)
Anesthesia Regional Block: Pectoralis block   Pre-Anesthetic Checklist: ,, timeout performed, Correct Patient, Correct Site, Correct Laterality, Correct Procedure, Correct Position, site marked, Risks and benefits discussed,  Surgical consent,  Pre-op evaluation,  At surgeon's request and post-op pain management  Laterality: Left  Prep: chloraprep       Needles:  Injection technique: Single-shot  Needle Type: Echogenic Needle     Needle Length: 9cm  Needle Gauge: 21     Additional Needles:   Procedures: ultrasound guided,,,,,,,,  Narrative:  Start time: 01/24/2017 10:10 AM End time: 01/24/2017 10:15 AM Injection made incrementally with aspirations every 5 mL.  Performed by: Personally  Anesthesiologist: Suella Broad D  Additional Notes: Tolerated well. No acute issue.

## 2017-01-24 NOTE — Anesthesia Procedure Notes (Signed)
Procedure Name: LMA Insertion Date/Time: 01/24/2017 11:34 AM Performed by: Myna Bright Pre-anesthesia Checklist: Patient identified, Emergency Drugs available, Suction available and Patient being monitored Patient Re-evaluated:Patient Re-evaluated prior to induction Oxygen Delivery Method: Circle system utilized Preoxygenation: Pre-oxygenation with 100% oxygen Induction Type: IV induction Ventilation: Mask ventilation without difficulty LMA: LMA inserted LMA Size: 4.0 Tube type: Oral Number of attempts: 1 Placement Confirmation: positive ETCO2 and breath sounds checked- equal and bilateral Tube secured with: Tape Dental Injury: Teeth and Oropharynx as per pre-operative assessment

## 2017-01-24 NOTE — Transfer of Care (Signed)
Immediate Anesthesia Transfer of Care Note  Patient: Sharon Reid  Procedure(s) Performed: Procedure(s): RIGHT SEED LOCALIZED LUMPECTOMY AND SEED TARGETED SENTINEL LYMPH NODE BIOPSY, LEFT BREAST SEED BRACKETED LUMPECTOMY AND SENTINEL LYMPH NODE BIOPSY (Bilateral)  Patient Location: PACU  Anesthesia Type:GA combined with regional for post-op pain  Level of Consciousness: alert , oriented, drowsy and patient cooperative  Airway & Oxygen Therapy: Patient Spontanous Breathing and Patient connected to nasal cannula oxygen  Post-op Assessment: Report given to RN, Post -op Vital signs reviewed and stable and Patient moving all extremities X 4  Post vital signs: Reviewed and stable  Last Vitals:  Vitals:   01/24/17 1005 01/24/17 1457  BP: (!) 145/98   Pulse: 82   Resp: 20   Temp:  (!) 36.2 C  SpO2: 97%     Last Pain:  Vitals:   01/24/17 1457  TempSrc:   PainSc: Asleep         Complications: No apparent anesthesia complications

## 2017-01-24 NOTE — Interval H&P Note (Signed)
History and Physical Interval Note:  01/24/2017 9:00 AM  [name]  has presented today for surgery, with the diagnosis of BILATERAL BREAST CANCER  The various methods of treatment have been discussed with the patient and family. After consideration of risks, benefits and other options for treatment, the patient has consented to  Procedure(s): RIGHT SEED LOCALIZED LUMPECTOMY AND SEED TARGETED SENTINEL LYMPH NODE BIOPSY, LEFT BREAST SEED BRACKETED LUMPECTOMY AND SENTINEL LYMPH NODE BIOPSY (Bilateral) as a surgical intervention .  The patient's history has been reviewed, patient examined, no change in status, stable for surgery.  I have reviewed the patient's chart and labs.  Questions were answered to the patient's satisfaction.     Sharon Reid

## 2017-01-24 NOTE — Op Note (Signed)
Left Seed Bracketed lumpectomy with sentinel lymph node biopsy.  Right Breast Radioactive seed localized lumpectomy and right seed targeted and sentinel lymph node mapping and biopsy  Indications: This patient presents with history of bilateral breast cancer.  Right cT2N1 upper outer quadrant ER/PR +, Her 2 neg.  Left cTisN0  Pre-operative Diagnosis: Bilateral breast cancer  Post-operative Diagnosis: Same  Surgeon: Stark Klein   Anesthesia: General endotracheal anesthesia  ASA Class: 3  Procedure Details  The patient was seen in the Holding Room. The risks, benefits, complications, treatment options, and expected outcomes were discussed with the patient. The possibilities of bleeding, infection, the need for additional procedures, failure to diagnose a condition, and creating a complication requiring transfusion or operation were discussed with the patient. The patient concurred with the proposed plan, giving informed consent.  The site of surgery properly noted/marked. The patient was taken to Operating Room # 9, identified, and the procedure verified as Right seed localized Breast Lumpectomy with targeted sentinel lymph node dissection, left seed bracketed lumpectomy with sentinel lymph node biopsy. A Time Out was held and the above information confirmed.  Methylene blue was injected into the subareolar breast tissue.    The bilateral breast and chest were prepped and draped in standard fashion. The left side was addressed first.  The lumpectomy was performed by creating a lateral circumareolar incision near the previously placed radioactive seeds.  Dissection was carried down to around both seeds. The cautery was used to perform the dissection.  Hemostasis was achieved with cautery. The edges of the cavity were marked with large clips, with one each medial, lateral, inferior and superior, and two clips posteriorly.   The specimen was inked with the margin marker paint kit. Specimen radiography  confirmed inclusion of the mammographic lesion, the clips, and the seeds.  The background signal in the breast was zero.  The wound was irrigated and closed with 3-0 vicryl in layers and 4-0 monocryl subcuticular suture.    Using a hand-held gamma probe, left axillary sentinel nodes were identified transcutaneously.  An oblique incision was created below the axillary hairline.  Dissection was carried through the clavipectoral fascia.  Two deep level 2 axillary sentinel nodes were removed.  Counts per second were 1210 and 1090.    The background count was 15 cps.  The wound was irrigated.  Hemostasis was achieved with cautery.  The axillary incision was closed with a 3-0 vicryl deep dermal interrupted sutures and a 4-0 monocryl subcuticular closure.  The right breast was addressed similarly.  A lateral circumareolar incision was used in this location as well.  The cautery was used for dissection.  The cavity was marked.  The right axilla was first evaluated for the seed localized node.  This was found easily.  It was quite large.  The lymphovascular channels were clipped.  We switched back over the the sentinel node setting and two remaining deep axillary sentinel lymph nodes were found.  Channels were clipped.  These incisions were also closed with 3-0 vicryl deep dermal interrupted sutures and 4-0 monocryl running subcuticular sutures.    Sterile dressings were applied. At the end of the operation, all sponge, instrument, and needle counts were correct.  Findings: grossly clear surgical margins and no adenopathy  Estimated Blood Loss:  min         Specimens: left breast lumpectomy with two seeds, left axillary sentinel lymph nodes x 2, right breast lumpectomy with 1 seed, right targeted node with seed, and two  additional right axillary sentinel lymph nodes.             Complications:  None; patient tolerated the procedure well.         Disposition: PACU - hemodynamically stable.          Condition: stable

## 2017-01-24 NOTE — Progress Notes (Signed)
Dr Smith Robert called and asked where to place the IV states ok to use either arm.

## 2017-01-24 NOTE — Anesthesia Preprocedure Evaluation (Addendum)
Anesthesia Evaluation  Patient identified by MRN, date of birth, ID band Patient awake    Reviewed: Allergy & Precautions, NPO status , Patient's Chart, lab work & pertinent test results  Airway Mallampati: II  TM Distance: >3 FB Neck ROM: Full    Dental  (+) Teeth Intact, Dental Advisory Given   Pulmonary neg pulmonary ROS,    breath sounds clear to auscultation       Cardiovascular hypertension, Pt. on medications  Rhythm:Regular Rate:Normal     Neuro/Psych negative neurological ROS  negative psych ROS   GI/Hepatic Neg liver ROS, GERD  ,  Endo/Other  negative endocrine ROS  Renal/GU negative Renal ROS     Musculoskeletal negative musculoskeletal ROS (+)   Abdominal   Peds  Hematology negative hematology ROS (+)   Anesthesia Other Findings Day of surgery medications reviewed with the patient.  Reproductive/Obstetrics                            Lab Results  Component Value Date   WBC 9.4 01/18/2017   HGB 12.6 01/18/2017   HCT 39.3 01/18/2017   MCV 90.3 01/18/2017   PLT 200 01/18/2017   Lab Results  Component Value Date   CREATININE 0.78 01/18/2017   BUN 10 01/18/2017   NA 138 01/18/2017   K 3.8 01/18/2017   CL 107 01/18/2017   CO2 23 01/18/2017   Lab Results  Component Value Date   INR 1.07 02/23/2015   EKG: normal sinus rhythm.  Anesthesia Physical Anesthesia Plan  ASA: III  Anesthesia Plan: General   Post-op Pain Management:  Regional for Post-op pain   Induction: Intravenous  PONV Risk Score and Plan: 4 or greater and Ondansetron, Dexamethasone, Midazolam, Scopolamine patch - Pre-op and Propofol infusion  Airway Management Planned: LMA  Additional Equipment:   Intra-op Plan:   Post-operative Plan: Extubation in OR  Informed Consent: I have reviewed the patients History and Physical, chart, labs and discussed the procedure including the risks, benefits and  alternatives for the proposed anesthesia with the patient or authorized representative who has indicated his/her understanding and acceptance.   Dental advisory given  Plan Discussed with: CRNA  Anesthesia Plan Comments:         Anesthesia Quick Evaluation

## 2017-01-24 NOTE — Discharge Instructions (Addendum)
[facility] °Office Phone Number [phone] ° °BREAST BIOPSY/ PARTIAL MASTECTOMY: POST OP INSTRUCTIONS ° °Always review your discharge instruction sheet given to you by the facility where your surgery was performed. ° °IF YOU HAVE DISABILITY OR FAMILY LEAVE FORMS, YOU MUST BRING THEM TO THE OFFICE FOR PROCESSING.  DO NOT GIVE THEM TO YOUR DOCTOR. ° °1. A prescription for pain medication may be given to you upon discharge.  Take your pain medication as prescribed, if needed.  If narcotic pain medicine is not needed, then you may take acetaminophen (Tylenol) or ibuprofen (Advil) as needed. °2. Take your usually prescribed medications unless otherwise directed °3. If you need a refill on your pain medication, please contact your pharmacy.  They will contact our office to request authorization.  Prescriptions will not be filled after 5pm or on week-ends. °4. You should eat very light the first 24 hours after surgery, such as soup, crackers, pudding, etc.  Resume your normal diet the day after surgery. °5. Most patients will experience some swelling and bruising in the breast.  Ice packs and a good support bra will help.  Swelling and bruising can take several days to resolve.  °6. It is common to experience some constipation if taking pain medication after surgery.  Increasing fluid intake and taking a stool softener will usually help or prevent this problem from occurring.  A mild laxative (Milk of Magnesia or Miralax) should be taken according to package directions if there are no bowel movements after 48 hours. °7. Unless discharge instructions indicate otherwise, you may remove your bandages 48 hours after surgery, and you may shower at that time.  You may have steri-strips (small skin tapes) in place directly over the incision.  These strips should be left on the skin for 7-10 days.   Any sutures or staples will be removed at the office during your follow-up visit. °8. ACTIVITIES:  You may resume  regular daily activities (gradually increasing) beginning the next day.  Wearing a good support bra or sports bra (or the breast binder) minimizes pain and swelling.  You may have sexual intercourse when it is comfortable. °a. You may drive when you no longer are taking prescription pain medication, you can comfortably wear a seatbelt, and you can safely maneuver your car and apply brakes. °b. RETURN TO WORK:  __________1 week_______________ °9. You should see your doctor in the office for a follow-up appointment approximately two weeks after your surgery.  Your doctor’s nurse will typically make your follow-up appointment when she calls you with your pathology report.  Expect your pathology report 2-3 business days after your surgery.  You may call to check if you do not hear from us after three days. ° ° °WHEN TO CALL YOUR DOCTOR: °1. Fever over 101.0 °2. Nausea and/or vomiting. °3. Extreme swelling or bruising. °4. Continued bleeding from incision. °5. Increased pain, redness, or drainage from the incision. ° °The clinic staff is available to answer your questions during regular business hours.  Please don’t hesitate to call and ask to speak to one of the nurses for clinical concerns.  If you have a medical emergency, go to the nearest emergency room or call 911.  A surgeon from [facility] is always on call at the hospital. ° °For further questions, please visit [email or web address]  ° °

## 2017-01-25 ENCOUNTER — Encounter (HOSPITAL_COMMUNITY): Payer: Self-pay | Admitting: General Surgery

## 2017-01-31 ENCOUNTER — Other Ambulatory Visit: Payer: Self-pay | Admitting: General Surgery

## 2017-01-31 ENCOUNTER — Encounter: Payer: Self-pay | Admitting: Hematology and Oncology

## 2017-01-31 ENCOUNTER — Ambulatory Visit (HOSPITAL_BASED_OUTPATIENT_CLINIC_OR_DEPARTMENT_OTHER): Payer: Medicare Other | Admitting: Hematology and Oncology

## 2017-01-31 DIAGNOSIS — C50411 Malignant neoplasm of upper-outer quadrant of right female breast: Secondary | ICD-10-CM | POA: Diagnosis not present

## 2017-01-31 DIAGNOSIS — D0512 Intraductal carcinoma in situ of left breast: Secondary | ICD-10-CM | POA: Diagnosis not present

## 2017-01-31 DIAGNOSIS — C773 Secondary and unspecified malignant neoplasm of axilla and upper limb lymph nodes: Secondary | ICD-10-CM

## 2017-01-31 DIAGNOSIS — Z170 Estrogen receptor positive status [ER+]: Secondary | ICD-10-CM | POA: Diagnosis not present

## 2017-01-31 NOTE — Assessment & Plan Note (Addendum)
08/14/2018Bilateral lumpectomies: Left: DCIS grade 3, 3.6 cm, 4.8 cm, with microinvasion, margins negative, 1/2 lymph nodes positive ER 0%, PR 0%; right: ILC grade 2, 1.8 cm, margin of resection focally positive anterior cauterized margin, 2/3 lymph nodes positive, ER 90%, PR 80%, HER-2 negative, Ki-67 10%  Mammaprint on the right side: Low risk luminal type a  Pathology counseling: I discussed the final pathology report of the patient provided  a copy of this report. I discussed the margins as well as lymph node surgeries. We also discussed the final staging along with previously performed ER/PR and HER-2/neu testing.  Treatment plan: 1. Obtain HER-2 testing on the lymph node on the left side 2. Resection of the positive margin on the right 3. Patient will need systemic chemotherapy but is HER-2 positive or negative for the left-sided breast cancer. We will present her the breast tumor board next Wednesday and I will see her back on Wednesday to discuss and finalize a treatment plan.

## 2017-01-31 NOTE — Progress Notes (Signed)
Patient Care Team: Nolene Ebbs, MD as PCP - General (Internal Medicine) Stark Klein, MD as Consulting Physician (General Surgery) Nicholas Lose, MD as Consulting Physician (Hematology and Oncology) Gery Pray, MD as Consulting Physician (Radiation Oncology)  DIAGNOSIS:  Encounter Diagnosis  Name Primary?  . Malignant neoplasm of upper-outer quadrant of right breast in female, estrogen receptor positive ([place])     SUMMARY OF ONCOLOGIC HISTORY:   Malignant neoplasm of upper-outer quadrant of right breast in female, estrogen receptor positive ([place])   10/28/2016 Initial Diagnosis    Screening detected right breast distortion at UOQ 10:00: 2.4 cm with abnormal lymph node in the axilla, biopsy invasive lobular cancer with LCIS ER 90%, PR 80%, Ki-67 10%, HER-2 negative ratio 1.48, lymph node biopsy also positive T2 N0 stage II a (New AJCC)      10/28/2016 Miscellaneous    Mammaprint low-risk luminal type A      11/28/2016 Breast MRI    malignancy in the retroareolar upper outer quadrant of the right breast 1.7 x 1.0 x 1.5 cm single right axillary lymph node, Suspicious clumped linear enhancement in the upper-outer quadrant of the LEFT breast      01/24/2017 Surgery    Bilateral lumpectomies: Left: DCIS grade 3, 3.6 cm, 4.8 cm, with microinvasion, margins negative, 1/2 lymph nodes positive ER 0%, PR 0%; right: ILC grade 2, 1.8 cm, margin of resection focally positive anterior cauterized margin, 2/3 lymph nodes positive, ER 90%, PR 80%, HER-2 negative, Ki-67 10%       CHIEF COMPLIANT: follow-up after recent bilateral lumpectomies  INTERVAL HISTORY: Sharon Reid is a 66 year old with above-mentioned history of right breast cancer who on breast MRI it was noted to have a left breast abnormality. She underwent bilateral lumpectomies and is here today to discuss the pathology report. On the right side patient had previously detected invasive lobular cancer with one out of 2 lymph  nodes being positive.the left side she had DCIS with microinvasion and 2 out of 3 lymph nodes were positive for breast cancer that is ER/PR negative. She is recovering fairly well from the surgeries and is here today to discuss the pathology report and adjuvant treatment plan.  REVIEW OF SYSTEMS:   Constitutional: Denies fevers, chills or abnormal weight loss Eyes: Denies blurriness of vision Ears, nose, mouth, throat, and face: Denies mucositis or sore throat Respiratory: Denies cough, dyspnea or wheezes Cardiovascular: Denies palpitation, chest discomfort Gastrointestinal:  Denies nausea, heartburn or change in bowel habits Skin: Denies abnormal skin rashes Lymphatics: Denies new lymphadenopathy or easy bruising Neurological:Denies numbness, tingling or new weaknesses Behavioral/Psych: Mood is stable, no new changes  Extremities: No lower extremity edema Breast: recent bilateral lumpectomies and sentinel lymph node biopsies All other systems were reviewed with the patient and are negative.  I have reviewed the past medical history, past surgical history, social history and family history with the patient and they are unchanged from previous note.  ALLERGIES:  is allergic to other.  MEDICATIONS:  Current Outpatient Prescriptions  Medication Sig Dispense Refill  . acetaminophen (TYLENOL) 500 MG tablet Take 500 mg by mouth every 6 (six) hours as needed for mild pain or headache.    Marland Kitchen amLODipine (NORVASC) 5 MG tablet Take 5 mg by mouth every evening.    Marland Kitchen anastrozole (ARIMIDEX) 1 MG tablet Take 1 tablet (1 mg total) by mouth daily. (Patient taking differently: Take 1 mg by mouth every evening. ) 90 tablet 3  . OVER THE COUNTER MEDICATION  Take 1 tablet by mouth as needed. Sinus relief medication. OTC    . oxyCODONE (OXY IR/ROXICODONE) 5 MG immediate release tablet Take 1-2 tablets (5-10 mg total) by mouth every 6 (six) hours as needed for moderate pain, severe pain or breakthrough pain. 20  tablet 0   No current facility-administered medications for this visit.     PHYSICAL EXAMINATION: ECOG PERFORMANCE STATUS: 1 - Symptomatic but completely ambulatory  Vitals:   01/31/17 1446  BP: 131/88  Pulse: 83  Resp: 18  Temp: 98.4 F (36.9 C)  SpO2: 100%   Filed Weights   01/31/17 1446  Weight: 255 lb 9.6 oz (115.9 kg)    GENERAL:alert, no distress and comfortable SKIN: skin color, texture, turgor are normal, no rashes or significant lesions EYES: normal, Conjunctiva are pink and non-injected, sclera clear OROPHARYNX:no exudate, no erythema and lips, buccal mucosa, and tongue normal  NECK: supple, thyroid normal size, non-tender, without nodularity LYMPH:  no palpable lymphadenopathy in the cervical, axillary or inguinal LUNGS: clear to auscultation and percussion with normal breathing effort HEART: regular rate & rhythm and no murmurs and no lower extremity edema ABDOMEN:abdomen soft, non-tender and normal bowel sounds MUSCULOSKELETAL:no cyanosis of digits and no clubbing  NEURO: alert & oriented x 3 with fluent speech, no focal motor/sensory deficits EXTREMITIES: No lower extremity edema  LABORATORY DATA:  I have reviewed the data as listed   Chemistry      Component Value Date/Time   NA 138 01/18/2017 0833   NA 142 August 05, 202018 0847   K 3.8 01/18/2017 0833   K 3.9 August 05, 202018 0847   CL 107 01/18/2017 0833   CO2 23 01/18/2017 0833   CO2 27 August 05, 202018 0847   BUN 10 01/18/2017 0833   BUN 9.4 August 05, 202018 0847   CREATININE 0.78 01/18/2017 0833   CREATININE 0.8 August 05, 202018 0847      Component Value Date/Time   CALCIUM 9.9 01/18/2017 0833   CALCIUM 10.4 August 05, 202018 0847   ALKPHOS 109 August 05, 202018 0847   AST 21 August 05, 202018 0847   ALT 25 August 05, 202018 0847   BILITOT 0.45 August 05, 202018 0847       Lab Results  Component Value Date   WBC 9.4 01/18/2017   HGB 12.6 01/18/2017   HCT 39.3 01/18/2017   MCV 90.3 01/18/2017   PLT 200 01/18/2017   NEUTROABS 5.3 August 05, 202018     ASSESSMENT & PLAN:  Malignant neoplasm of upper-outer quadrant of right breast in female, estrogen receptor positive ([place]) 08/14/2018Bilateral lumpectomies: Left: DCIS grade 3, 3.6 cm, 4.8 cm, with microinvasion, margins negative, 1/2 lymph nodes positive ER 0%, PR 0%; right: ILC grade 2, 1.8 cm, margin of resection focally positive anterior cauterized margin, 2/3 lymph nodes positive, ER 90%, PR 80%, HER-2 negative, Ki-67 10%  Mammaprint on the right side: Low risk luminal type a  Pathology counseling: I discussed the final pathology report of the patient provided  a copy of this report. I discussed the margins as well as lymph node surgeries. We also discussed the final staging along with previously performed ER/PR and HER-2/neu testing.  Treatment plan: 1. Obtain HER-2 testing on the lymph node on the left side 2. Resection of the positive margin on the right 3. Patient will need systemic chemotherapy but is HER-2 positive or negative for the left-sided breast cancer. We will present her the breast tumor board next Wednesday and I will see her back on Wednesday to discuss and finalize a treatment plan.  I spent 25 minutes talking to  the patient of which more than half was spent in counseling and coordination of care.  No orders of the defined types were placed in this encounter.  The patient has a good understanding of the overall plan. she agrees with it. she will call with any problems that may develop before the next visit here.   Rulon Eisenmenger, MD 01/31/17

## 2017-01-31 NOTE — Progress Notes (Signed)
Discussed pathology with patient.  She has appt with Dr. Lindi Adie today to discuss as well.  Mammaprint was low risk, but has 1/2 LN positive on the left and 2/3 LN positive on the right.  Not sure if this was sent from both sides.

## 2017-02-03 ENCOUNTER — Encounter: Payer: Self-pay | Admitting: Hematology and Oncology

## 2017-02-08 ENCOUNTER — Ambulatory Visit (HOSPITAL_BASED_OUTPATIENT_CLINIC_OR_DEPARTMENT_OTHER): Payer: Medicare Other | Admitting: Hematology and Oncology

## 2017-02-08 ENCOUNTER — Other Ambulatory Visit: Payer: Self-pay

## 2017-02-08 DIAGNOSIS — C773 Secondary and unspecified malignant neoplasm of axilla and upper limb lymph nodes: Secondary | ICD-10-CM

## 2017-02-08 DIAGNOSIS — C50411 Malignant neoplasm of upper-outer quadrant of right female breast: Secondary | ICD-10-CM

## 2017-02-08 DIAGNOSIS — D0502 Lobular carcinoma in situ of left breast: Secondary | ICD-10-CM | POA: Diagnosis not present

## 2017-02-08 DIAGNOSIS — Z170 Estrogen receptor positive status [ER+]: Secondary | ICD-10-CM | POA: Diagnosis not present

## 2017-02-08 DIAGNOSIS — Z5181 Encounter for therapeutic drug level monitoring: Secondary | ICD-10-CM

## 2017-02-08 NOTE — Assessment & Plan Note (Signed)
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 Resection of positive margin 2. Port placement 3. Adj Chemo with TCHP foll by Herceptin maintenance to complete 6 months of therapy 4. ECHO 5. Chemo class Start chemo Oct 1st

## 2017-02-08 NOTE — Progress Notes (Signed)
Patient Care Team: Nolene Ebbs, MD as PCP - General (Internal Medicine) Stark Klein, MD as Consulting Physician (General Surgery) Nicholas Lose, MD as Consulting Physician (Hematology and Oncology) Gery Pray, MD as Consulting Physician (Radiation Oncology)  DIAGNOSIS:  Encounter Diagnosis  Name Primary?  . Malignant neoplasm of upper-outer quadrant of right breast in female, estrogen receptor positive ([place])     SUMMARY OF ONCOLOGIC HISTORY:   Malignant neoplasm of upper-outer quadrant of right breast in female, estrogen receptor positive ([place])   10/28/2016 Initial Diagnosis    Screening detected right breast distortion at UOQ 10:00: 2.4 cm with abnormal lymph node in the axilla, biopsy invasive lobular cancer with LCIS ER 90%, PR 80%, Ki-67 10%, HER-2 negative ratio 1.48, lymph node biopsy also positive T2 N0 stage II a (New AJCC)      10/28/2016 Miscellaneous    Mammaprint low-risk luminal type A      11/28/2016 Breast MRI    malignancy in the retroareolar upper outer quadrant of the right breast 1.7 x 1.0 x 1.5 cm single right axillary lymph node, Suspicious clumped linear enhancement in the upper-outer quadrant of the LEFT breast      01/24/2017 Surgery    Bilateral lumpectomies: Left: DCIS grade 3, 3.6 cm, 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 She is scheduled to undergo margin reexcision and port placement next week.  REVIEW OF SYSTEMS:   Constitutional: Denies fevers, chills or abnormal weight loss Eyes: Denies blurriness of  vision Ears, nose, mouth, throat, and face: Denies mucositis or sore throat Respiratory: Denies cough, dyspnea or wheezes Cardiovascular: Denies palpitation, chest discomfort Gastrointestinal:  Denies nausea, heartburn or change in bowel habits Skin: Denies abnormal skin rashes Lymphatics: Denies new lymphadenopathy or easy bruising Neurological:Denies numbness, tingling or new weaknesses Behavioral/Psych: Mood is stable, no new changes  Extremities: No lower extremity edema  All other systems were reviewed with the patient and are negative.  I have reviewed the past medical history, past surgical history, social history and family history with the patient and they are unchanged from previous note.  ALLERGIES:  is allergic to other.  MEDICATIONS:  Current Outpatient Prescriptions  Medication Sig Dispense Refill  . acetaminophen (TYLENOL) 500 MG tablet Take 250 mg by mouth every 6 (six) hours as needed (for pain/headaches.).     Marland Kitchen amLODipine (NORVASC) 5 MG tablet Take 5 mg by mouth every evening.    Marland Kitchen anastrozole (ARIMIDEX) 1 MG tablet Take 1 tablet (1 mg total) by mouth daily. (Patient taking differently: Take 1 mg by mouth every evening. ) 90 tablet 3  . oxyCODONE (OXY IR/ROXICODONE) 5 MG immediate release tablet Take 1-2 tablets (5-10 mg total) by mouth every 6 (six) hours as needed for moderate pain, severe pain or breakthrough pain. (Patient not taking: Reported on 02/08/2017) 20 tablet 0  . sodium chloride (OCEAN) 0.65 % SOLN nasal spray Place 1 spray into both nostrils 3 (three) times daily as needed for congestion.     No current facility-administered medications for this visit.  PHYSICAL EXAMINATION: ECOG PERFORMANCE STATUS: 1  Vitals:   02/08/17 1540  BP: 135/87  Pulse: 80  Resp: 16  Temp: 98.3 F (36.8 C)  SpO2: 98%   Filed Weights   02/08/17 1540  Weight: 255 lb (115.7 kg)    GENERAL:alert, no distress and comfortable SKIN: skin color, texture, turgor are  normal, no rashes or significant lesions EYES: normal, Conjunctiva are pink and non-injected, sclera clear OROPHARYNX:no exudate, no erythema and lips, buccal mucosa, and tongue normal  NECK: supple, thyroid normal size, non-tender, without nodularity LYMPH:  no palpable lymphadenopathy in the cervical, axillary or inguinal LUNGS: clear to auscultation and percussion with normal breathing effort HEART: regular rate & rhythm and no murmurs and no lower extremity edema ABDOMEN:abdomen soft, non-tender and normal bowel sounds MUSCULOSKELETAL:no cyanosis of digits and no clubbing  NEURO: alert & oriented x 3 with fluent speech, no focal motor/sensory deficits EXTREMITIES: No lower extremity edema  LABORATORY DATA:  I have reviewed the data as listed   Chemistry      Component Value Date/Time   NA 138 01/18/2017 0833   NA 142 01-15-2017 0847   K 3.8 01/18/2017 0833   K 3.9 01-15-2017 0847   CL 107 01/18/2017 0833   CO2 23 01/18/2017 0833   CO2 27 01-15-2017 0847   BUN 10 01/18/2017 0833   BUN 9.4 01-15-2017 0847   CREATININE 0.78 01/18/2017 0833   CREATININE 0.8 01-15-2017 0847      Component Value Date/Time   CALCIUM 9.9 01/18/2017 0833   CALCIUM 10.4 01-15-2017 0847   ALKPHOS 109 01-15-2017 0847   AST 21 01-15-2017 0847   ALT 25 01-15-2017 0847   BILITOT 0.45 01-15-2017 0847       Lab Results  Component Value Date   WBC 9.4 01/18/2017   HGB 12.6 01/18/2017   HCT 39.3 01/18/2017   MCV 90.3 01/18/2017   PLT 200 01/18/2017   NEUTROABS 5.3 01-15-2017    ASSESSMENT & PLAN:  Malignant neoplasm of upper-outer quadrant of right breast in female, estrogen receptor positive ([place]) 08/14/2018Bilateral lumpectomies: Left: DCIS grade 3, 3.6 cm, 4.8 cm, with microinvasion, margins negative, 1/2 lymph nodes positive ER 0%, PR 0%; HER 2 Positive right: ILC grade 2, 1.8 cm, margin of resection focally positive anterior cauterized margin, 2/3 lymph nodes positive, ER 90%, PR 80%, HER-2  negative, Ki-67 10%  Mammaprint on the right side: Low risk luminal type a  Plan: 1. Resection of positive margin 2. Port placement 3. Adj Chemo with TCHP foll by Herceptin maintenance to complete 6 months of therapy 4. ECHO 5. Chemo class Start chemo Oct 1st Counseled extensively about chemo toxicities  I spent 25 minutes talking to the patient of which more than half was spent in counseling and coordination of care.  No orders of the defined types were placed in this encounter.  The patient has a good understanding of the overall plan. she agrees with it. she will call with any problems that may develop before the next visit here.   Rulon Eisenmenger, MD 02/08/17

## 2017-02-09 ENCOUNTER — Telehealth: Payer: Self-pay | Admitting: *Deleted

## 2017-02-09 NOTE — Telephone Encounter (Signed)
Spoke with patient and confirmed echo appointment for 03/10/17 at 11am.

## 2017-02-10 ENCOUNTER — Other Ambulatory Visit: Payer: Self-pay | Admitting: *Deleted

## 2017-02-10 ENCOUNTER — Encounter (HOSPITAL_COMMUNITY): Payer: Self-pay | Admitting: *Deleted

## 2017-02-10 ENCOUNTER — Encounter: Payer: Self-pay | Admitting: Radiation Oncology

## 2017-02-10 ENCOUNTER — Telehealth: Payer: Self-pay | Admitting: *Deleted

## 2017-02-10 DIAGNOSIS — Z170 Estrogen receptor positive status [ER+]: Principal | ICD-10-CM

## 2017-02-10 DIAGNOSIS — C50411 Malignant neoplasm of upper-outer quadrant of right female breast: Secondary | ICD-10-CM

## 2017-02-10 NOTE — Progress Notes (Signed)
Pt denies cardiac history, chest pain or sob. Pt states she is not diabetic. 

## 2017-02-10 NOTE — Telephone Encounter (Signed)
Pt called Dr. Lindi Adie nurse to inform she has decided to forgo chemotherapy. I called pt to verify she has decided against chemo. Pt relate she does NOT want chemotherapy and does not want to have to endure the side effects from chemotherapy. Informed pt that I will cancel her appts related to chemo and will refer to Dr. Sondra Come for xrt. Received verbal understanding Physician team notified.

## 2017-02-12 MED ORDER — CEFAZOLIN SODIUM-DEXTROSE 2-4 GM/100ML-% IV SOLN
2.0000 g | INTRAVENOUS | Status: AC
  Administered 2017-02-14: 2 g via INTRAVENOUS
  Filled 2017-02-12: qty 100

## 2017-02-14 ENCOUNTER — Encounter (HOSPITAL_COMMUNITY)
Admission: RE | Disposition: A | Discharge status: Home / Self Care | Payer: Self-pay | Source: Ambulatory Visit | Attending: General Surgery

## 2017-02-14 ENCOUNTER — Ambulatory Visit (HOSPITAL_COMMUNITY): Payer: Medicare Other | Admitting: Anesthesiology

## 2017-02-14 ENCOUNTER — Ambulatory Visit (HOSPITAL_COMMUNITY)
Admission: RE | Admit: 2017-02-14 | Discharge: 2017-02-14 | Disposition: A | Discharge status: Home / Self Care | Payer: Medicare Other | Source: Ambulatory Visit | Attending: General Surgery | Admitting: General Surgery

## 2017-02-14 DIAGNOSIS — I10 Essential (primary) hypertension: Secondary | ICD-10-CM | POA: Insufficient documentation

## 2017-02-14 DIAGNOSIS — Z171 Estrogen receptor negative status [ER-]: Secondary | ICD-10-CM | POA: Diagnosis not present

## 2017-02-14 DIAGNOSIS — C50911 Malignant neoplasm of unspecified site of right female breast: Secondary | ICD-10-CM | POA: Insufficient documentation

## 2017-02-14 DIAGNOSIS — K219 Gastro-esophageal reflux disease without esophagitis: Secondary | ICD-10-CM | POA: Insufficient documentation

## 2017-02-14 HISTORY — PX: RE-EXCISION OF BREAST LUMPECTOMY: SHX6048

## 2017-02-14 LAB — CBC
HCT: 37.7 % (ref 36.0–46.0)
Hemoglobin: 11.9 g/dL — ABNORMAL LOW (ref 12.0–15.0)
MCH: 29.2 pg (ref 26.0–34.0)
MCHC: 31.6 g/dL (ref 30.0–36.0)
MCV: 92.4 fL (ref 78.0–100.0)
Platelets: 212 10*3/uL (ref 150–400)
RBC: 4.08 MIL/uL (ref 3.87–5.11)
RDW: 12.5 % (ref 11.5–15.5)
WBC: 8.2 10*3/uL (ref 4.0–10.5)

## 2017-02-14 LAB — BASIC METABOLIC PANEL WITH GFR
Anion gap: 6 (ref 5–15)
BUN: 12 mg/dL (ref 6–20)
CO2: 25 mmol/L (ref 22–32)
Calcium: 9.9 mg/dL (ref 8.9–10.3)
Chloride: 110 mmol/L (ref 101–111)
Creatinine, Ser: 0.8 mg/dL (ref 0.44–1.00)
GFR calc Af Amer: >60 mL/min
GFR calc non Af Amer: >60 mL/min
Glucose, Bld: 105 mg/dL — ABNORMAL HIGH (ref 65–99)
Potassium: 3.8 mmol/L (ref 3.5–5.1)
Sodium: 141 mmol/L (ref 135–145)

## 2017-02-14 SURGERY — EXCISION, LESION, BREAST
Anesthesia: General | Site: Breast | Laterality: Right

## 2017-02-14 MED ORDER — FENTANYL CITRATE (PF) 100 MCG/2ML IJ SOLN
INTRAMUSCULAR | Status: DC | PRN
  Administered 2017-02-14: 50 ug via INTRAVENOUS
  Administered 2017-02-14 (×4): 25 ug via INTRAVENOUS

## 2017-02-14 MED ORDER — LIDOCAINE HCL (PF) 1 % IJ SOLN
INTRAMUSCULAR | Status: AC
  Filled 2017-02-14: qty 30

## 2017-02-14 MED ORDER — PROPOFOL 10 MG/ML IV BOLUS
INTRAVENOUS | Status: DC | PRN
  Administered 2017-02-14: 200 mg via INTRAVENOUS

## 2017-02-14 MED ORDER — MIDAZOLAM HCL 5 MG/5ML IJ SOLN
INTRAMUSCULAR | Status: DC | PRN
  Administered 2017-02-14: 1 mg via INTRAVENOUS

## 2017-02-14 MED ORDER — PHENYLEPHRINE 40 MCG/ML (10ML) SYRINGE FOR IV PUSH (FOR BLOOD PRESSURE SUPPORT)
PREFILLED_SYRINGE | INTRAVENOUS | Status: AC
  Filled 2017-02-14: qty 10

## 2017-02-14 MED ORDER — ACETAMINOPHEN 650 MG RE SUPP
650.0000 mg | RECTAL | Status: DC | PRN
Start: 2017-02-14 — End: 2017-02-14

## 2017-02-14 MED ORDER — OXYCODONE HCL 5 MG PO TABS: 5.0000 mg | ORAL_TABLET | ORAL | Status: DC | PRN

## 2017-02-14 MED ORDER — 0.9 % SODIUM CHLORIDE (POUR BTL) OPTIME
TOPICAL | Status: DC | PRN
  Administered 2017-02-14: 1000 mL

## 2017-02-14 MED ORDER — DEXAMETHASONE SODIUM PHOSPHATE 10 MG/ML IJ SOLN
INTRAMUSCULAR | Status: AC
  Filled 2017-02-14: qty 1

## 2017-02-14 MED ORDER — LIDOCAINE HCL 1 % IJ SOLN
INTRAMUSCULAR | Status: DC | PRN
  Administered 2017-02-14: 10 mL

## 2017-02-14 MED ORDER — CHLORHEXIDINE GLUCONATE CLOTH 2 % EX PADS: 6.0000 | MEDICATED_PAD | Freq: Once | CUTANEOUS | Status: DC

## 2017-02-14 MED ORDER — LIDOCAINE 2% (20 MG/ML) 5 ML SYRINGE
INTRAMUSCULAR | Status: AC
  Filled 2017-02-14: qty 15

## 2017-02-14 MED ORDER — BUPIVACAINE-EPINEPHRINE (PF) 0.25% -1:200000 IJ SOLN
INTRAMUSCULAR | Status: AC
  Filled 2017-02-14: qty 30

## 2017-02-14 MED ORDER — ACETAMINOPHEN 325 MG PO TABS: 650.0000 mg | ORAL_TABLET | ORAL | Status: DC | PRN

## 2017-02-14 MED ORDER — HYDROMORPHONE HCL 1 MG/ML IJ SOLN: 0.2500 mg | INTRAMUSCULAR | Status: DC | PRN

## 2017-02-14 MED ORDER — FENTANYL CITRATE (PF) 250 MCG/5ML IJ SOLN
INTRAMUSCULAR | Status: AC
  Filled 2017-02-14: qty 5

## 2017-02-14 MED ORDER — PHENYLEPHRINE HCL 10 MG/ML IJ SOLN
INTRAMUSCULAR | Status: DC | PRN
  Administered 2017-02-14 (×2): 80 ug via INTRAVENOUS
  Administered 2017-02-14 (×2): 40 ug via INTRAVENOUS
  Administered 2017-02-14: 80 ug via INTRAVENOUS

## 2017-02-14 MED ORDER — LACTATED RINGERS IV SOLN
INTRAVENOUS | Status: DC | PRN
  Administered 2017-02-14: 07:00:00 via INTRAVENOUS

## 2017-02-14 MED ORDER — PROPOFOL 10 MG/ML IV BOLUS
INTRAVENOUS | Status: AC
  Filled 2017-02-14: qty 20

## 2017-02-14 MED ORDER — ONDANSETRON HCL 4 MG/2ML IJ SOLN
INTRAMUSCULAR | Status: AC
  Filled 2017-02-14: qty 2

## 2017-02-14 MED ORDER — DEXAMETHASONE SODIUM PHOSPHATE 10 MG/ML IJ SOLN
INTRAMUSCULAR | Status: DC | PRN
  Administered 2017-02-14: 5 mg via INTRAVENOUS

## 2017-02-14 MED ORDER — ONDANSETRON HCL 4 MG/2ML IJ SOLN
INTRAMUSCULAR | Status: DC | PRN
  Administered 2017-02-14: 4 mg via INTRAVENOUS

## 2017-02-14 MED ORDER — MIDAZOLAM HCL 2 MG/2ML IJ SOLN
INTRAMUSCULAR | Status: AC
  Filled 2017-02-14: qty 2

## 2017-02-14 MED ORDER — LIDOCAINE 2% (20 MG/ML) 5 ML SYRINGE
INTRAMUSCULAR | Status: DC | PRN
  Administered 2017-02-14: 100 mg via INTRAVENOUS

## 2017-02-14 SURGICAL SUPPLY — 51 items
ADH SKN CLS APL DERMABOND .7 (GAUZE/BANDAGES/DRESSINGS) ×1
BINDER BREAST LRG (GAUZE/BANDAGES/DRESSINGS) IMPLANT
BINDER BREAST XLRG (GAUZE/BANDAGES/DRESSINGS) ×2 IMPLANT
BLADE SURG 10 STRL SS (BLADE) ×1 IMPLANT
BLADE SURG 15 STRL LF DISP TIS (BLADE) IMPLANT
BLADE SURG 15 STRL SS (BLADE) ×3
CANISTER SUCT 3000ML PPV (MISCELLANEOUS) ×3 IMPLANT
CHLORAPREP W/TINT 26ML (MISCELLANEOUS) ×3 IMPLANT
CLIP VESOCCLUDE LG 6/CT (CLIP) IMPLANT
CLOSURE WOUND 1/2 X4 (GAUZE/BANDAGES/DRESSINGS) ×1
CONT SPEC 4OZ CLIKSEAL STRL BL (MISCELLANEOUS) ×1 IMPLANT
COVER SURGICAL LIGHT HANDLE (MISCELLANEOUS) ×3 IMPLANT
DERMABOND ADVANCED (GAUZE/BANDAGES/DRESSINGS) ×2
DERMABOND ADVANCED .7 DNX12 (GAUZE/BANDAGES/DRESSINGS) ×1 IMPLANT
DRAPE CHEST BREAST 15X10 FENES (DRAPES) ×3 IMPLANT
DRAPE UTILITY XL STRL (DRAPES) ×3 IMPLANT
DRSG PAD ABDOMINAL 8X10 ST (GAUZE/BANDAGES/DRESSINGS) ×3 IMPLANT
ELECT CAUTERY BLADE 6.4 (BLADE) ×3 IMPLANT
ELECT REM PT RETURN 9FT ADLT (ELECTROSURGICAL) ×3
ELECTRODE REM PT RTRN 9FT ADLT (ELECTROSURGICAL) ×1 IMPLANT
GAUZE SPONGE 4X4 12PLY STRL (GAUZE/BANDAGES/DRESSINGS) ×3 IMPLANT
GLOVE BIO SURGEON STRL SZ 6 (GLOVE) ×3 IMPLANT
GLOVE BIOGEL PI IND STRL 6.5 (GLOVE) ×1 IMPLANT
GLOVE BIOGEL PI INDICATOR 6.5 (GLOVE) ×2
GOWN STRL REUS W/ TWL LRG LVL3 (GOWN DISPOSABLE) ×1 IMPLANT
GOWN STRL REUS W/TWL 2XL LVL3 (GOWN DISPOSABLE) ×3 IMPLANT
GOWN STRL REUS W/TWL LRG LVL3 (GOWN DISPOSABLE) ×3
ILLUMINATOR WAVEGUIDE N/F (MISCELLANEOUS) IMPLANT
KIT BASIN OR (CUSTOM PROCEDURE TRAY) ×3 IMPLANT
KIT MARKER MARGIN INK (KITS) IMPLANT
KIT ROOM TURNOVER OR (KITS) ×3 IMPLANT
LIGHT WAVEGUIDE WIDE FLAT (MISCELLANEOUS) IMPLANT
NDL HYPO 25GX1X1/2 BEV (NEEDLE) ×1 IMPLANT
NEEDLE HYPO 25GX1X1/2 BEV (NEEDLE) ×3 IMPLANT
NS IRRIG 1000ML POUR BTL (IV SOLUTION) ×3 IMPLANT
PACK SURGICAL SETUP 50X90 (CUSTOM PROCEDURE TRAY) ×3 IMPLANT
PAD ARMBOARD 7.5X6 YLW CONV (MISCELLANEOUS) ×3 IMPLANT
PENCIL BUTTON HOLSTER BLD 10FT (ELECTRODE) ×3 IMPLANT
SPONGE LAP 18X18 X RAY DECT (DISPOSABLE) ×3 IMPLANT
STRIP CLOSURE SKIN 1/2X4 (GAUZE/BANDAGES/DRESSINGS) ×2 IMPLANT
SUT MNCRL AB 4-0 PS2 18 (SUTURE) ×3 IMPLANT
SUT SILK 2 0 FS (SUTURE) ×2 IMPLANT
SUT VIC AB 3-0 SH 27 (SUTURE) ×3
SUT VIC AB 3-0 SH 27X BRD (SUTURE) ×1 IMPLANT
SYR BULB 3OZ (MISCELLANEOUS) ×3 IMPLANT
SYR CONTROL 10ML LL (SYRINGE) ×3 IMPLANT
TOWEL OR 17X24 6PK STRL BLUE (TOWEL DISPOSABLE) ×3 IMPLANT
TOWEL OR 17X26 10 PK STRL BLUE (TOWEL DISPOSABLE) ×1 IMPLANT
TUBE CONNECTING 12'X1/4 (SUCTIONS) ×1
TUBE CONNECTING 12X1/4 (SUCTIONS) ×2 IMPLANT
YANKAUER SUCT BULB TIP NO VENT (SUCTIONS) ×3 IMPLANT

## 2017-02-14 NOTE — H&P (View-Only) (Signed)
Sharon Reid 01/03/2017 2:48 PM Location: [facility] Patient #: [id number] DOB: 11/19/1950 Single / Language: English / Race: Black or African American Female   History of Present Illness (Miski Feldpausch MD; 01/03/2017 3:53 PM) The patient is a 66 year old female who presents for a follow-up for Breast cancer. Pt is a 66 yo F diagnosed with cT2N1 right breast cancer in May 2018 She presented with screening detected distortion and mass. She had not had mammograms before. She did not palpate any masses. Imaging showed a 2.4 cm mass in the upper outer quadrant of the right breast and a concerning LN. Core needle biopsy showed Grade 2 invasive lobular carcinoma, ER/PR + and her 2 negative, Ki67 10%. Breast density is category C. She had a concerning lesion of the left that was benign and concordant.  She underwent MRI due to the lobular nature of the breast cancer. She was found to have abnormality on the left side. This was biopsied in the anterior and posterior aspects and was positive for DCIS. Total area of abnormality was 6-7 cm. She is here to discuss treatment options.   MRI FINDINGS: Breast composition: c. Heterogeneous fibroglandular tissue.  Background parenchymal enhancement: Mild to moderate  Right breast: In the retroareolar upper outer quadrant of the right breast is an enhancing irregular mass with central biopsy clip artifact. The mass measures 1.7 x 1.0 x 1.5 cm on MRI and corresponds to the biopsy-proven malignancy. No additional suspicious areas of enhancement are identified in the right breast. No residual post biopsy hematoma is seen.  Left breast: There is clumped linear enhancement in the upper-outer quadrant of the left breast, anterior to middle thirds, that spans approximately 4 cm anterior to posterior diameter. Malignancy cannot be excluded.  Biopsy clip artifact is noted in the outer left breast at the site of the prior benign  biopsy.  Lymph nodes: A right axillary level 1 lymph node with an overall normal size, but slight cortical thickening contains biopsy clip artifact and corresponds to the biopsy proven metastatic right axillary lymph node. No additional suspicious lymph nodes are seen in either axilla.  Ancillary findings: None.  IMPRESSION: 1. Biopsy proven malignancy in the retroareolar upper outer quadrant of the right breast measures 1.7 x 1.0 x 1.5 cm and contains central clip artifact. No additional suspicious areas of enhancement in the right breast 2. Cortical thickening of a single right axillary lymph node, previously biopsied, with metastasis demonstrated at pathology. 3. Suspicious clumped linear enhancement in the upper-outer quadrant of the LEFT breast.  Path on left breast 12/16/2016 Diagnosis 1. Breast, left, needle core biopsy, anterior - DUCTAL CARCINOMA IN SITU. - SEE COMMENT. 2. Breast, left, needle core biopsy, posterior - DUCTAL CARCINOMA IN SITU WITH CALCIFICATIONS. - SEE COMMENT. ER/PR negative Microscopic Comment 1. & 2. The carcinoma in the two specimens is morphologically similar and appears high grade.    Allergies (Christen Lambert, RMA; 01/03/2017 2:49 PM) No Known Allergies 01/03/2017  Medication History (Christen Lambert, RMA; 01/03/2017 2:49 PM) AmLODIPine Besylate (5MG Tablet, Oral) Active. Anastrozole (1MG Tablet, Oral) Active. Medications Reconciled    Review of Systems (Leather Estis MD; 01/03/2017 3:53 PM) All other systems negative  Vitals (Christen Lambert RMA; 01/03/2017 2:49 PM) 01/03/2017 2:49 PM Weight: 254 lb Height: 65in Body Surface Area: 2.19 m Body Mass Index: 42.27 kg/m  Temp.: 98.1F  Pulse: 85 (Regular)  BP: 124/94 (Sitting, Left Arm, Standard)       Physical Exam (Taressa Rauh   MD; 01/03/2017 3:54 PM) General Mental Status-Alert. General Appearance-Consistent with stated age. Hydration-Well  hydrated. Voice-Normal.  Head and Neck Head-normocephalic, atraumatic with no lesions or palpable masses.  Chest and Lung Exam Chest and lung exam reveals -quiet, even and easy respiratory effort with no use of accessory muscles. Inspection Chest Wall - Normal. Back - normal.  Breast Note: no palpable masses, but + hematoma at bx site on left. no LAD     Assessment & Plan (Daeshon Grammatico MD; 01/03/2017 3:57 PM) PRIMARY CANCER OF UPPER OUTER QUADRANT OF RIGHT FEMALE BREAST (C50.411) Impression: We will plan seed loc lumpectomy and targeted SLN bx. She will need a seed in the right breast and one in the right axilla.  I reviewed surgery with the patient. This breast cancer will be the determining factor for prognosis. She will need radiation on this side due to the positive node.  The surgical procedure was described to the patient. I discussed the incision type and location and that we would need radiology involved on with a wire or seed marker and/or sentinel node.  The risks and benefits of the procedure were described to the patient and she wishes to proceed.  We discussed the risks bleeding, infection, damage to other structures, need for further procedures/surgeries. We discussed the risk of seroma. The patient was advised if the area in the breast in cancer, we may need to go back to surgery for additional tissue to obtain negative margins or for a lymph node biopsy. The patient was advised that these are the most common complications, but that others can occur as well. They were advised against taking aspirin or other anti-inflammatory agents/blood thinners the week before surgery. Current Plans Pt Education - flb breast cancer surgery: discussed with patient and provided information. 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 We discussed that it is a bit more complicated and a  larger area. There is a higher chance for positive margins and we may need to do additional surgery on this side.  She understands and wishes to proceed. Because of the size of the DCIS, we will plan for SLN on this side as well. she would also get XRT on the left .  Orders are written in EPIC including two SLN orders and Two seed orders for left breast, and one seed for right breast, one seed for right axilla.    Signed by Maryellen Dowdle, MD (01/03/2017 3:58 PM)  

## 2017-02-14 NOTE — Op Note (Signed)
Re-excisional Right Breast Lumpectomy   Indications: This patient presents with history of focally positive anterior margin after partial mastectomy for right breast cancer   Pre-operative Diagnosis: Right breast cancer   Post-operative Diagnosis: Right breast cancer   Surgeon: [id number]   Assistants: n/a   Anesthesia: General anesthesia and Local anesthesia 0.5% bupivacaine, with epinephrine   ASA Class: 3   Procedure Details  The patient was seen in the Holding Room. The risks, benefits, complications, treatment options, and expected outcomes were discussed with the patient. The possibilities of reaction to medication, pulmonary aspiration, bleeding, infection, the need for additional procedures, failure to diagnose a condition, and creating a complication requiring transfusion or operation were discussed with the patient. The patient concurred with the proposed plan, giving informed consent. The site of surgery properly noted/marked. The patient was taken to Operating Room # 1, identified, and the procedure verified as re-excision of right breast cancer.  After induction of anesthesia, the right breast and chest were prepped and draped in standard fashion.  The lumpectomy was performed by reopening the prior incision. Seroma was aspirated.  Additional margins were taken at the anterior border of the partial mastectomy cavity. Orientation sutures were placed in the specimens. Hemostasis was achieved with cautery. The wound was irrigated and closed with a 3-0 Vicryl deep dermal interrupted and a 4-0 Monocryl subcuticular closure in layers.  Sterile dressings were applied. At the end of the operation, all sponge, instrument, and needle counts were correct.   Findings:  grossly clear surgical margins, skin is anterior margin  Estimated Blood Loss: Minimal   Drains: none   Specimens: additional anterior margins.   Complications: None; patient tolerated the procedure well.    Disposition: PACU - hemodynamically stable.   Condition: stable

## 2017-02-14 NOTE — Anesthesia Preprocedure Evaluation (Signed)
Anesthesia Evaluation  Patient identified by MRN, date of birth, ID band Patient awake    Reviewed: Allergy & Precautions, NPO status , Patient's Chart, lab work & pertinent test results  Airway Mallampati: II  TM Distance: >3 FB     Dental   Pulmonary neg pulmonary ROS,    breath sounds clear to auscultation       Cardiovascular hypertension,  Rhythm:Regular Rate:Normal     Neuro/Psych    GI/Hepatic Neg liver ROS, GERD  ,  Endo/Other  negative endocrine ROS  Renal/GU negative Renal ROS     Musculoskeletal   Abdominal   Peds  Hematology   Anesthesia Other Findings   Reproductive/Obstetrics                             Anesthesia Physical Anesthesia Plan  ASA: III  Anesthesia Plan: General   Post-op Pain Management:    Induction: Intravenous  PONV Risk Score and Plan: 3 and Ondansetron, Dexamethasone, Midazolam, Propofol infusion and Treatment may vary due to age or medical condition  Airway Management Planned: LMA  Additional Equipment:   Intra-op Plan:   Post-operative Plan: Extubation in OR  Informed Consent: I have reviewed the patients History and Physical, chart, labs and discussed the procedure including the risks, benefits and alternatives for the proposed anesthesia with the patient or authorized representative who has indicated his/her understanding and acceptance.   Dental advisory given  Plan Discussed with: Anesthesiologist and CRNA  Anesthesia Plan Comments:         Anesthesia Quick Evaluation

## 2017-02-14 NOTE — Anesthesia Procedure Notes (Signed)
Procedure Name: LMA Insertion Date/Time: 02/14/2017 7:35 AM Performed by: Salli Quarry Zaria Taha Pre-anesthesia Checklist: Patient identified, Emergency Drugs available, Suction available and Patient being monitored Patient Re-evaluated:Patient Re-evaluated prior to induction Oxygen Delivery Method: Circle System Utilized Preoxygenation: Pre-oxygenation with 100% oxygen Induction Type: IV induction Ventilation: Mask ventilation without difficulty LMA: LMA inserted LMA Size: 4.0 Number of attempts: 1 Placement Confirmation: positive ETCO2 Tube secured with: Tape Dental Injury: Teeth and Oropharynx as per pre-operative assessment

## 2017-02-14 NOTE — Anesthesia Postprocedure Evaluation (Signed)
Anesthesia Post Note  Patient: Sharon Reid  Procedure(s) Performed: Procedure(s) (LRB): RE-EXCISION RIGHT BREAST LUMPECTOMY (Right)     Patient location during evaluation: PACU Anesthesia Type: General Level of consciousness: awake Pain management: pain level controlled Respiratory status: spontaneous breathing Cardiovascular status: stable Postop Assessment: no signs of nausea or vomiting Anesthetic complications: no    Last Vitals:  Vitals:   02/14/17 0832 02/14/17 0845  BP: (!) 162/85 (!) 166/85  Pulse:  71  Resp:  18  Temp:  (!) 36.3 C  SpO2:  93%    Last Pain:  Vitals:   02/14/17 0629  TempSrc: Oral  PainSc:                  Emmary Culbreath

## 2017-02-14 NOTE — Transfer of Care (Signed)
Immediate Anesthesia Transfer of Care Note  Patient: Sharon Reid  Procedure(s) Performed: Procedure(s): RE-EXCISION RIGHT BREAST LUMPECTOMY (Right)  Patient Location: PACU  Anesthesia Type:General  Level of Consciousness: awake, alert  and patient cooperative  Airway & Oxygen Therapy: Patient Spontanous Breathing  Post-op Assessment: Report given to RN and Post -op Vital signs reviewed and stable  Post vital signs: Reviewed and stable  Last Vitals:  Vitals:   02/14/17 0629  BP: (!) 179/92  Pulse: 80  Resp: 18  Temp: 36.7 C  SpO2: 99%    Last Pain:  Vitals:   02/14/17 0629  TempSrc: Oral  PainSc:       Patients Stated Pain Goal: 1 (35/57/32 2025)  Complications: No apparent anesthesia complications

## 2017-02-14 NOTE — Interval H&P Note (Signed)
History and Physical Interval Note:  02/14/2017 7:05 AM  Sharon Reid  has presented today for surgery, with the diagnosis of BILATERAL BREAST CANCER  The various methods of treatment have been discussed with the patient and family. After consideration of risks, benefits and other options for treatment, the patient has consented to  Procedure(s): RE-EXCISION OF RIGHT BREAST LUMPECTOMY (Right) as a surgical intervention .  The patient's history has been reviewed, patient examined, no change in status, stable for surgery.  I have reviewed the patient's chart and labs.  Questions were answered to the patient's satisfaction.     Azyah Flett

## 2017-02-14 NOTE — Anesthesia Postprocedure Evaluation (Signed)
Anesthesia Post Note  Patient: Sharon Reid  Procedure(s) Performed: Procedure(s) (LRB): RE-EXCISION RIGHT BREAST LUMPECTOMY (Right)     Patient location during evaluation: PACU Anesthesia Type: General Level of consciousness: awake Pain management: pain level controlled Vital Signs Assessment: post-procedure vital signs reviewed and stable Respiratory status: spontaneous breathing Cardiovascular status: stable Anesthetic complications: no    Last Vitals:  Vitals:   02/14/17 0832 02/14/17 0845  BP: (!) 162/85 (!) 166/85  Pulse:  71  Resp:  18  Temp:  (!) 36.3 C  SpO2:  93%    Last Pain:  Vitals:   02/14/17 0629  TempSrc: Oral  PainSc:                  [name]

## 2017-02-15 ENCOUNTER — Encounter (HOSPITAL_COMMUNITY): Payer: Self-pay | Admitting: General Surgery

## 2017-02-15 NOTE — Progress Notes (Signed)
Please let patient know margin is negative!  So no more surgery.

## 2017-02-20 NOTE — Progress Notes (Signed)
Location of Breast Cancer: upper-outer quadrant of right breast  Histology per Pathology Report:   02/14/17 Diagnosis Breast, excision, Right anterior margin - FIBROCYSTIC CHANGES INCLUDING APOCRINE METAPLASIA - ATYPICAL LOBULAR HYPERPLASIA - PREVIOUS SURGICAL SITE CHANGES - NO RESIDUAL CARCINOMA IDENTIFIED  01/24/17 Diagnosis 1. Breast, lumpectomy, Left w/seed DUCTAL CARCINOMA IN SITU, GRADE 3, SPANNING 3.6 AND 4.8 CM SUSPICIOUS FOR MICROSCOPIC INVASION ATYPICAL LOBULAR HYPERPLASIA ALL MARGINS OF RESECTION ARE NEGATIVE FOR CARCINOMA CARCINOMA IS 1.0 MM FROM THE MEDIAL AND POSTERIOR MARGIN 2. Lymph node, sentinel, biopsy, Left axillary #1 METASTATIC DUCTAL CARCINOMA (1/1, 1.1 CM) 3. Lymph node, sentinel, biopsy, Left axillary #2 ONE BENIGN LYMPH NODE (0/1) 4. Breast, lumpectomy, Right w/seed INVASIVE LOBULAR CARCINOMA, GRADE 2, SPANNING 1.8 CM LOBULAR CARCINOMA IN SITU IS PRESENT MARGIN OF RESECTION IS FOCALLY POSITIVE AT THE ANTERIOR CAUTERIZED MARGIN 5. Lymph node, sentinel, biopsy, Right axillary METASTATIC LOBULAR CARCINOMA IN ONE OF ONE LYMPH NODE (1/1) 6. Lymph node, sentinel, biopsy, Right Axillary #2 METASTATIC LOBULAR CARCINOMA IN ONE OF ONE LYMPH NODE (1/1) 7. Lymph node, sentinel, biopsy, Right Axillary #3 ONE BENIGN LYMPH NODE (0/1)  12/16/16 Diagnosis 1. Breast, left, needle core biopsy, anterior - DUCTAL CARCINOMA IN SITU. - SEE COMMENT. 2. Breast, left, needle core biopsy, posterior - DUCTAL CARCINOMA IN SITU WITH CALCIFICATIONS. - SEE COMMENT.  10/28/16  Receptor Status: 12/16/16 left breast: ER(0), PR (0), Her2-neu (positive), Ki-(), 10/28/16 ER 90%, PR 80%, HER2 negative, Ki67 10%  Did patient present with symptoms (if so, please note symptoms) or was this found on screening mammography?: screening mammogram  Past/Anticipated interventions by surgeon, if any: 02/14/17 -Procedure: RE-EXCISION RIGHT BREAST LUMPECTOMY;  Surgeon: Stark Klein, MD;  01/24/17 -  Procedure: RIGHT SEED LOCALIZED LUMPECTOMY AND SEED TARGETED SENTINEL LYMPH NODE BIOPSY, LEFT BREAST SEED BRACKETED LUMPECTOMY AND SENTINEL LYMPH NODE BIOPSY;  Surgeon: Stark Klein, MD  Past/Anticipated interventions by medical oncology, if any: Dr. Lindi Adie is recommending Adj Chemo with TCHP foll by Herceptin maintenance to complete 6 months of therapy.  Patient has declined to have chemotherapy.  Lymphedema issues, if any:  no   Pain issues, if any:  no   SAFETY ISSUES:  Prior radiation? no  Pacemaker/ICD? no  Possible current pregnancy?no  Is the patient on methotrexate? no  Current Complaints / other details:   Patient has declined chemotherapy due to concerns about the side effects.  BP 131/90 (BP Location: Left Wrist, Patient Position: Sitting)   Pulse 82   Temp 98.5 F (36.9 C) (Oral)   Ht '5\' 5"'  (1.651 m)   Wt 259 lb (117.5 kg)   SpO2 99%   BMI 43.10 kg/m   Wt Readings from Last 3 Encounters:  02/27/17 259 lb (117.5 kg)  02/14/17 255 lb (115.7 kg)  02/08/17 255 lb (115.7 kg)      Hess, Craige Cotta, RN 02/20/2017,10:00 AM

## 2017-02-27 ENCOUNTER — Ambulatory Visit
Admission: RE | Admit: 2017-02-27 | Discharge: 2017-02-27 | Disposition: A | Discharge status: Home / Self Care | Payer: Medicare Other | Source: Ambulatory Visit | Attending: Radiation Oncology | Admitting: Radiation Oncology

## 2017-02-27 ENCOUNTER — Encounter: Payer: Self-pay | Admitting: Radiation Oncology

## 2017-02-27 DIAGNOSIS — Z171 Estrogen receptor negative status [ER-]: Secondary | ICD-10-CM | POA: Diagnosis present

## 2017-02-27 DIAGNOSIS — Z170 Estrogen receptor positive status [ER+]: Secondary | ICD-10-CM | POA: Diagnosis present

## 2017-02-27 DIAGNOSIS — C50412 Malignant neoplasm of upper-outer quadrant of left female breast: Secondary | ICD-10-CM | POA: Insufficient documentation

## 2017-02-27 DIAGNOSIS — C50411 Malignant neoplasm of upper-outer quadrant of right female breast: Secondary | ICD-10-CM

## 2017-02-27 NOTE — Progress Notes (Signed)
Please see the Nurse Progress Note in the MD Initial Consult Encounter for this patient. 

## 2017-02-27 NOTE — Progress Notes (Signed)
Radiation Oncology         [phone] ________________________________  Name: Sharon Reid MRN: [id number]  Date: 02/27/2017  DOB: Sep 28, 1950  Re-evaluation  Note  CC: Nolene Ebbs, MD  Nicholas Lose, MD    ICD-10-CM   1. Malignant neoplasm of upper-outer quadrant of right breast in female, estrogen receptor positive ([place]) C50.411    Z17.0   2. 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  Narrative:  The patient returns today for follow-up. The patient was originally seen for consultation in our multidisciplinary breast clinic on 11/09/16. Since that time she underwent right breast lumpectomy with sentinel node biopsy and left breast lumpectomy with sentinel node biopsy on 01/24/17 with Dr. Barry Dienes.   Final pathology of left breast lumpectomy with sentinel node showed DCIS, grade 3, spanning 3.6 and 4.8 cm, suspicious for microscopic invasion, atypical lobular hyperplasia. All margins of resection are negative for carcinoma. Carcinoma is 1.0 mm from the medial and posterior margin. 1 out of 2 left axillary lymph nodes sampled are positive for metastatic ductal carcinoma, HER-2 positive. There are extensive ductal carcinoma in situ present with focal areas suspicious for microscopic invasion. Immunostain for E-cadherin is positive on part 1 and part 2 lymph node, supporting the diagnosis of ductal carcinoma genotype with metastasis to sentinel lymph node.   Final pathology of right breast lumpectomy with sentinel showed invasive lobular carcinoma, grade 2, spanning 1.8 cm. ER 90%, PR 80%, HER-2 negative, Ki-67 10%. Lobular carcinoma in situ is present. Margin of resection is focally positive at the anterior cauterized  margin. 2 out of 3 right axillary lymph nodes sampled are positive for metastatic lobular carcinoma.  She then underwent re-excision of right breast lumpectomy on 02/14/17 with Dr. Barry Dienes. Excision of right anterior margin shows fibrocystic changes including apocrine metaplasia, atypical lobular hyperplasia, previous surgical site changes and no residual carcinoma identified.   She is doing well overall. She denies any current lymphedema issues. She denies any pain issues. She did notice tenderness and swelling in the right arm and axillary region after surgery but the swelling has since resolved. She does still note some tenderness in this area. Patient has declined adjuvant chemotherapy due to concerns about the side effects. She last saw Dr. Lindi Adie on 02/08/17.                        ALLERGIES:  is allergic to other.  Meds: Current Outpatient Prescriptions  Medication Sig Dispense Refill  . acetaminophen (TYLENOL) 500 MG tablet Take 250 mg by mouth every 6 (six) hours as needed (for pain/headaches.).     Marland Kitchen amLODipine (NORVASC) 5 MG tablet Take 5 mg by mouth every evening.    . sodium chloride (OCEAN) 0.65 % SOLN nasal spray Place 1 spray into both nostrils 3 (three) times daily as needed for congestion.    Marland Kitchen anastrozole (ARIMIDEX) 1 MG tablet Take 1 tablet (1 mg total) by mouth daily. (Patient not taking: Reported on 02/27/2017) 90 tablet 3  . oxyCODONE (OXY IR/ROXICODONE) 5 MG immediate release tablet Take 1-2 tablets (5-10 mg total) by mouth every 6 (six) hours as needed for moderate pain, severe pain or breakthrough pain. (  Patient not taking: Reported on 02/08/2017) 20 tablet 0   No current facility-administered medications for this encounter.     Physical Findings: The patient is in no acute distress. Patient is alert and oriented.  height is _0  (1.651 m) and weight is 259 lb (117.5 kg). Her oral temperature is 98.5 F (36.9 C). Her blood pressure is 131/90 and her pulse is 82. Her  oxygen saturation is 99%. .  Lungs are clear to auscultation bilaterally. Heart has regular rate and rhythm. No palpable cervical, supraclavicular, or axillary adenopathy. Abdomen soft, non-tender, normal bowel sounds. Left breast exam shows periareolar scar on the lateral aspect of the areola which is healing well without signs of drainage or infection. She has a separate scar in the axillary region which is healing well without signs of drainage or infection. Right breast exam shows periareolar scar in the upper outer aspect which is also healing well without signs of drainage or infection. She has a separate scar in the upper outer quadrant from sentinel node procedure which is also healing well.  Lab Findings: Lab Results  Component Value Date   WBC 8.2 02/14/2017   HGB 11.9 (L) 02/14/2017   HCT 37.7 02/14/2017   MCV 92.4 02/14/2017   PLT 212 02/14/2017    Radiographic Findings: No results found.  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  She met with Dr. Lindi Adie and elected not to proceed with adjuvant chemotherapy as recommended.  We discussed the natural history of bilateral breast cancer and general treatment, highlighting the role of radiotherapy in the management.  We discussed the available radiation techniques, and focused on the details of logistics and delivery.  We reviewed the anticipated acute and late sequelae associated with radiation in this setting.  The patient was encouraged to ask questions that I answered to the best of my ability. The patient would like to proceed with radiation and will be scheduled for CT simulation.  I would anticipate 6 1/2 weeks radiation treatment to bilateral breasts, as well as bilateral axillary regions.  Plan:  She will return approximately 5 weeks after surgery for CT simulation  and we will plan to begin radiation treatment 6 weeks post-op.  -----------------------------------  [name], PhD, MD  This document serves as a record of services personally performed by Gery Pray, MD. It was created on his behalf by Arlyce Harman, a trained medical scribe. The creation of this record is based on the scribe's personal observations and the provider's statements to them. This document has been checked and approved by the attending provider.

## 2017-03-10 ENCOUNTER — Other Ambulatory Visit (HOSPITAL_COMMUNITY): Payer: Medicare Other

## 2017-03-10 ENCOUNTER — Other Ambulatory Visit: Payer: Medicare Other

## 2017-03-13 ENCOUNTER — Ambulatory Visit: Payer: Medicare Other | Admitting: Hematology and Oncology

## 2017-03-13 ENCOUNTER — Other Ambulatory Visit: Payer: Medicare Other

## 2017-03-13 ENCOUNTER — Ambulatory Visit: Payer: Medicare Other

## 2017-03-20 ENCOUNTER — Ambulatory Visit: Payer: Medicare Other | Admitting: Hematology and Oncology

## 2017-03-20 ENCOUNTER — Ambulatory Visit
Admission: RE | Admit: 2017-03-20 | Discharge: 2017-03-20 | Disposition: A | Discharge status: Home / Self Care | Payer: Medicare Other | Source: Ambulatory Visit | Attending: Radiation Oncology | Admitting: Radiation Oncology

## 2017-03-20 ENCOUNTER — Other Ambulatory Visit: Payer: Medicare Other

## 2017-03-20 DIAGNOSIS — Z171 Estrogen receptor negative status [ER-]: Principal | ICD-10-CM

## 2017-03-20 DIAGNOSIS — C50411 Malignant neoplasm of upper-outer quadrant of right female breast: Secondary | ICD-10-CM

## 2017-03-20 DIAGNOSIS — Z170 Estrogen receptor positive status [ER+]: Secondary | ICD-10-CM

## 2017-03-20 DIAGNOSIS — C50412 Malignant neoplasm of upper-outer quadrant of left female breast: Secondary | ICD-10-CM | POA: Diagnosis not present

## 2017-03-20 NOTE — Progress Notes (Signed)
  Radiation Oncology         (336) [phone] ________________________________  Name: Sharon Reid MRN: [id number]  Date: 03/20/2017  DOB: Dec 26, 1950  SIMULATION AND TREATMENT PLANNING NOTE    ICD-10-CM   1. Malignant neoplasm of upper-outer quadrant of left breast in female, estrogen receptor negative ([place]) C50.412    Z17.1   2. 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  NARRATIVE:  The patient was brought to the [facility].  Identity was confirmed.  All relevant records and images related to the planned course of therapy were reviewed.  The patient freely provided informed written consent to proceed with treatment after reviewing the details related to the planned course of therapy. The consent form was witnessed and verified by the simulation staff.  Then, the patient was set-up in a stable reproducible  supine position for radiation therapy.  CT images were obtained.  Surface markings were placed.  The CT images were loaded into the planning software.  Then the target and avoidance structures were contoured.  Treatment planning then occurred.  The radiation prescription was entered and confirmed.  Then, I designed and supervised the construction of a total of 9 medically necessary complex treatment devices.  I have requested : 3D Simulation  I have requested a DVH of the following structures: heart, lungs, lumpectomy cavities.  I have ordered:dose calc.  PLAN:  The patient will receive 50.4 Gy in 28 fractions directed at both breast areas. The bilateral axillary areas will receive receive 45 gray in 25 fractions. The left breast lumpectomy cavity will receive a 12 gray boost and the right lumpectomy cavity  will receive a 10 gray boost.    -----------------------------------  [name], PhD, MD  This document serves as a record of services personally performed by Gery Pray, MD. It was created on his behalf by Arlyce Harman, a trained medical scribe. The creation of this record is based on the scribe's personal observations and the provider's statements to them. This document has been checked and approved by the attending provider.

## 2017-03-22 ENCOUNTER — Telehealth: Payer: Self-pay | Admitting: Hematology and Oncology

## 2017-03-22 NOTE — Telephone Encounter (Signed)
Scheduled appt per 10/8 sch message - patient is aware of appt date and time .  

## 2017-03-27 ENCOUNTER — Ambulatory Visit: Payer: Medicare Other | Admitting: Radiation Oncology

## 2017-03-27 DIAGNOSIS — C50412 Malignant neoplasm of upper-outer quadrant of left female breast: Secondary | ICD-10-CM | POA: Diagnosis not present

## 2017-03-28 ENCOUNTER — Ambulatory Visit
Admission: RE | Admit: 2017-03-28 | Discharge: 2017-03-28 | Disposition: A | Discharge status: Home / Self Care | Payer: Medicare Other | Source: Ambulatory Visit | Attending: Radiation Oncology | Admitting: Radiation Oncology

## 2017-03-28 ENCOUNTER — Ambulatory Visit: Payer: Medicare Other

## 2017-03-28 DIAGNOSIS — C50412 Malignant neoplasm of upper-outer quadrant of left female breast: Secondary | ICD-10-CM

## 2017-03-28 DIAGNOSIS — C50411 Malignant neoplasm of upper-outer quadrant of right female breast: Secondary | ICD-10-CM

## 2017-03-28 DIAGNOSIS — Z171 Estrogen receptor negative status [ER-]: Principal | ICD-10-CM

## 2017-03-28 DIAGNOSIS — Z170 Estrogen receptor positive status [ER+]: Secondary | ICD-10-CM

## 2017-03-28 NOTE — Progress Notes (Signed)
  Radiation Oncology         (336) [phone] ________________________________  Name: Sharon Reid MRN: [id number]  Date: 03/28/2017  DOB: 06-23-1950  Simulation Verification Note  Status: outpatient  NARRATIVE: The patient was brought to the treatment unit and placed in the planned treatment position. The clinical setup was verified. Then port films were obtained and uploaded to the radiation oncology medical record software.  The treatment beams were carefully compared against the planned radiation fields. The position location and shape of the radiation fields was reviewed. They targeted volume of tissue appears to be appropriately covered by the radiation beams. Organs at risk appear to be excluded as planned.  Based on my personal review, I approved the simulation verification. The patient's treatment will proceed as planned.  -----------------------------------  [name], PhD, MD

## 2017-03-29 ENCOUNTER — Ambulatory Visit: Payer: Medicare Other

## 2017-03-29 ENCOUNTER — Ambulatory Visit
Admission: RE | Admit: 2017-03-29 | Discharge: 2017-03-29 | Disposition: A | Discharge status: Home / Self Care | Payer: Medicare Other | Source: Ambulatory Visit | Attending: Radiation Oncology | Admitting: Radiation Oncology

## 2017-03-29 DIAGNOSIS — C50412 Malignant neoplasm of upper-outer quadrant of left female breast: Secondary | ICD-10-CM | POA: Diagnosis not present

## 2017-03-30 ENCOUNTER — Ambulatory Visit
Admission: RE | Admit: 2017-03-30 | Discharge: 2017-03-30 | Disposition: A | Discharge status: Home / Self Care | Payer: Medicare Other | Source: Ambulatory Visit | Attending: Radiation Oncology | Admitting: Radiation Oncology

## 2017-03-30 DIAGNOSIS — C50412 Malignant neoplasm of upper-outer quadrant of left female breast: Secondary | ICD-10-CM | POA: Diagnosis not present

## 2017-03-31 ENCOUNTER — Ambulatory Visit
Admission: RE | Admit: 2017-03-31 | Discharge: 2017-03-31 | Disposition: A | Discharge status: Home / Self Care | Payer: Medicare Other | Source: Ambulatory Visit | Attending: Radiation Oncology | Admitting: Radiation Oncology

## 2017-03-31 DIAGNOSIS — C50412 Malignant neoplasm of upper-outer quadrant of left female breast: Secondary | ICD-10-CM | POA: Diagnosis not present

## 2017-04-03 ENCOUNTER — Ambulatory Visit: Payer: Medicare Other

## 2017-04-03 ENCOUNTER — Ambulatory Visit
Admission: RE | Admit: 2017-04-03 | Discharge: 2017-04-03 | Disposition: A | Discharge status: Home / Self Care | Payer: Medicare Other | Source: Ambulatory Visit | Attending: Radiation Oncology | Admitting: Radiation Oncology

## 2017-04-03 DIAGNOSIS — C50412 Malignant neoplasm of upper-outer quadrant of left female breast: Secondary | ICD-10-CM | POA: Diagnosis not present

## 2017-04-04 ENCOUNTER — Ambulatory Visit
Admission: RE | Admit: 2017-04-04 | Discharge: 2017-04-04 | Disposition: A | Discharge status: Home / Self Care | Payer: Medicare Other | Source: Ambulatory Visit | Attending: Radiation Oncology | Admitting: Radiation Oncology

## 2017-04-04 DIAGNOSIS — C50412 Malignant neoplasm of upper-outer quadrant of left female breast: Secondary | ICD-10-CM

## 2017-04-04 DIAGNOSIS — Z171 Estrogen receptor negative status [ER-]: Principal | ICD-10-CM

## 2017-04-04 DIAGNOSIS — C50411 Malignant neoplasm of upper-outer quadrant of right female breast: Secondary | ICD-10-CM

## 2017-04-04 DIAGNOSIS — Z170 Estrogen receptor positive status [ER+]: Secondary | ICD-10-CM

## 2017-04-04 MED ORDER — RADIAPLEXRX EX GEL
Freq: Once | CUTANEOUS | Status: AC
  Administered 2017-04-04: 16:00:00 via TOPICAL

## 2017-04-04 MED ORDER — ALRA NON-METALLIC DEODORANT (RAD-ONC)
1.0000 "application " | Freq: Once | TOPICAL | Status: AC
  Administered 2017-04-04: 1 via TOPICAL

## 2017-04-04 NOTE — Progress Notes (Signed)
Pt here for patient teaching.  Pt given Radiation and You booklet, skin care instructions, Alra deodorant and Radiaplex gel.  Reviewed areas of pertinence such as fatigue, skin changes, breast tenderness and breast swelling . Pt able to give teach back of to pat skin and use unscented/gentle soap,apply Radiaplex bid, avoid applying anything to skin within 4 hours of treatment and to use an electric razor if they must shave. Pt demonstrated understanding and verbalizes understanding of information given and will contact nursing with any questions or concerns.          

## 2017-04-05 ENCOUNTER — Ambulatory Visit
Admission: RE | Admit: 2017-04-05 | Discharge: 2017-04-05 | Disposition: A | Discharge status: Home / Self Care | Payer: Medicare Other | Source: Ambulatory Visit | Attending: Radiation Oncology | Admitting: Radiation Oncology

## 2017-04-05 DIAGNOSIS — C50412 Malignant neoplasm of upper-outer quadrant of left female breast: Secondary | ICD-10-CM | POA: Diagnosis not present

## 2017-04-06 ENCOUNTER — Ambulatory Visit
Admission: RE | Admit: 2017-04-06 | Discharge: 2017-04-06 | Disposition: A | Discharge status: Home / Self Care | Payer: Medicare Other | Source: Ambulatory Visit | Attending: Radiation Oncology | Admitting: Radiation Oncology

## 2017-04-06 DIAGNOSIS — C50412 Malignant neoplasm of upper-outer quadrant of left female breast: Secondary | ICD-10-CM | POA: Diagnosis not present

## 2017-04-07 ENCOUNTER — Ambulatory Visit
Admission: RE | Admit: 2017-04-07 | Discharge: 2017-04-07 | Disposition: A | Discharge status: Home / Self Care | Payer: Medicare Other | Source: Ambulatory Visit | Attending: Radiation Oncology | Admitting: Radiation Oncology

## 2017-04-07 ENCOUNTER — Telehealth: Payer: Self-pay | Admitting: Oncology

## 2017-04-07 ENCOUNTER — Encounter: Payer: Self-pay | Admitting: Radiation Oncology

## 2017-04-07 DIAGNOSIS — C50412 Malignant neoplasm of upper-outer quadrant of left female breast: Secondary | ICD-10-CM | POA: Diagnosis not present

## 2017-04-07 NOTE — Telephone Encounter (Signed)
Called patient regarding her email stating she is having back pain from treatment.  She said her lower back is hurting and she is also having shooting pains up her right side which she thinks is from laying on the treatment table for radiation.  She said she has had this type of back pain before.  She said she took Tylenol before her treatment yesterday and it helped for awhile.  She said the pain flared again during the night.  She does have Oxycodone but does not want to take it because she is driving. Advised her to take Tylenol again today and she can see a physician If needed after her treatment.  She verbalized agreement.

## 2017-04-10 ENCOUNTER — Ambulatory Visit: Payer: Medicare Other

## 2017-04-10 ENCOUNTER — Ambulatory Visit
Admission: RE | Admit: 2017-04-10 | Discharge: 2017-04-10 | Disposition: A | Discharge status: Home / Self Care | Payer: Medicare Other | Source: Ambulatory Visit | Attending: Radiation Oncology | Admitting: Radiation Oncology

## 2017-04-10 DIAGNOSIS — C50412 Malignant neoplasm of upper-outer quadrant of left female breast: Secondary | ICD-10-CM | POA: Diagnosis not present

## 2017-04-11 ENCOUNTER — Ambulatory Visit
Admission: RE | Admit: 2017-04-11 | Discharge: 2017-04-11 | Disposition: A | Discharge status: Home / Self Care | Payer: Medicare Other | Source: Ambulatory Visit | Attending: Radiation Oncology | Admitting: Radiation Oncology

## 2017-04-11 DIAGNOSIS — C50412 Malignant neoplasm of upper-outer quadrant of left female breast: Secondary | ICD-10-CM | POA: Diagnosis not present

## 2017-04-12 ENCOUNTER — Ambulatory Visit
Admission: RE | Admit: 2017-04-12 | Discharge: 2017-04-12 | Disposition: A | Discharge status: Home / Self Care | Payer: Medicare Other | Source: Ambulatory Visit | Attending: Radiation Oncology | Admitting: Radiation Oncology

## 2017-04-12 DIAGNOSIS — C50412 Malignant neoplasm of upper-outer quadrant of left female breast: Secondary | ICD-10-CM | POA: Diagnosis not present

## 2017-04-13 ENCOUNTER — Ambulatory Visit
Admission: RE | Admit: 2017-04-13 | Discharge: 2017-04-13 | Disposition: A | Discharge status: Home / Self Care | Payer: Medicare Other | Source: Ambulatory Visit | Attending: Radiation Oncology | Admitting: Radiation Oncology

## 2017-04-13 DIAGNOSIS — C50412 Malignant neoplasm of upper-outer quadrant of left female breast: Secondary | ICD-10-CM | POA: Diagnosis not present

## 2017-04-14 ENCOUNTER — Ambulatory Visit
Admission: RE | Admit: 2017-04-14 | Discharge: 2017-04-14 | Disposition: A | Discharge status: Home / Self Care | Payer: Medicare Other | Source: Ambulatory Visit | Attending: Radiation Oncology | Admitting: Radiation Oncology

## 2017-04-14 DIAGNOSIS — C50412 Malignant neoplasm of upper-outer quadrant of left female breast: Secondary | ICD-10-CM | POA: Diagnosis not present

## 2017-04-17 ENCOUNTER — Ambulatory Visit: Payer: Medicare Other

## 2017-04-17 ENCOUNTER — Ambulatory Visit
Admission: RE | Admit: 2017-04-17 | Discharge: 2017-04-17 | Disposition: A | Discharge status: Home / Self Care | Payer: Medicare Other | Source: Ambulatory Visit | Attending: Radiation Oncology | Admitting: Radiation Oncology

## 2017-04-17 DIAGNOSIS — C50412 Malignant neoplasm of upper-outer quadrant of left female breast: Secondary | ICD-10-CM | POA: Diagnosis not present

## 2017-04-18 ENCOUNTER — Ambulatory Visit: Payer: Medicare Other

## 2017-04-19 ENCOUNTER — Ambulatory Visit
Admission: RE | Admit: 2017-04-19 | Discharge: 2017-04-19 | Disposition: A | Discharge status: Home / Self Care | Payer: Medicare Other | Source: Ambulatory Visit | Attending: Radiation Oncology | Admitting: Radiation Oncology

## 2017-04-19 DIAGNOSIS — C50412 Malignant neoplasm of upper-outer quadrant of left female breast: Secondary | ICD-10-CM | POA: Diagnosis not present

## 2017-04-20 ENCOUNTER — Ambulatory Visit
Admission: RE | Admit: 2017-04-20 | Discharge: 2017-04-20 | Disposition: A | Discharge status: Home / Self Care | Payer: Medicare Other | Source: Ambulatory Visit | Attending: Radiation Oncology | Admitting: Radiation Oncology

## 2017-04-20 DIAGNOSIS — C50412 Malignant neoplasm of upper-outer quadrant of left female breast: Secondary | ICD-10-CM | POA: Diagnosis not present

## 2017-04-21 ENCOUNTER — Ambulatory Visit
Admission: RE | Admit: 2017-04-21 | Discharge: 2017-04-21 | Disposition: A | Discharge status: Home / Self Care | Payer: Medicare Other | Source: Ambulatory Visit | Attending: Radiation Oncology | Admitting: Radiation Oncology

## 2017-04-21 DIAGNOSIS — C50412 Malignant neoplasm of upper-outer quadrant of left female breast: Secondary | ICD-10-CM | POA: Diagnosis not present

## 2017-04-24 ENCOUNTER — Ambulatory Visit
Admission: RE | Admit: 2017-04-24 | Discharge: 2017-04-24 | Disposition: A | Discharge status: Home / Self Care | Payer: Medicare Other | Source: Ambulatory Visit | Attending: Radiation Oncology | Admitting: Radiation Oncology

## 2017-04-24 DIAGNOSIS — C50412 Malignant neoplasm of upper-outer quadrant of left female breast: Secondary | ICD-10-CM | POA: Diagnosis not present

## 2017-04-25 ENCOUNTER — Ambulatory Visit
Admission: RE | Admit: 2017-04-25 | Discharge: 2017-04-25 | Disposition: A | Discharge status: Home / Self Care | Payer: Medicare Other | Source: Ambulatory Visit | Attending: Radiation Oncology | Admitting: Radiation Oncology

## 2017-04-25 DIAGNOSIS — C50412 Malignant neoplasm of upper-outer quadrant of left female breast: Secondary | ICD-10-CM | POA: Diagnosis not present

## 2017-04-26 ENCOUNTER — Ambulatory Visit
Admission: RE | Admit: 2017-04-26 | Discharge: 2017-04-26 | Disposition: A | Discharge status: Home / Self Care | Payer: Medicare Other | Source: Ambulatory Visit | Attending: Radiation Oncology | Admitting: Radiation Oncology

## 2017-04-26 DIAGNOSIS — C50412 Malignant neoplasm of upper-outer quadrant of left female breast: Secondary | ICD-10-CM | POA: Diagnosis not present

## 2017-04-27 ENCOUNTER — Ambulatory Visit
Admission: RE | Admit: 2017-04-27 | Discharge: 2017-04-27 | Disposition: A | Discharge status: Home / Self Care | Payer: Medicare Other | Source: Ambulatory Visit | Attending: Radiation Oncology | Admitting: Radiation Oncology

## 2017-04-27 DIAGNOSIS — C50412 Malignant neoplasm of upper-outer quadrant of left female breast: Secondary | ICD-10-CM | POA: Diagnosis not present

## 2017-04-28 ENCOUNTER — Ambulatory Visit
Admission: RE | Admit: 2017-04-28 | Discharge: 2017-04-28 | Disposition: A | Discharge status: Home / Self Care | Payer: Medicare Other | Source: Ambulatory Visit | Attending: Radiation Oncology | Admitting: Radiation Oncology

## 2017-04-28 DIAGNOSIS — C50412 Malignant neoplasm of upper-outer quadrant of left female breast: Secondary | ICD-10-CM | POA: Diagnosis not present

## 2017-04-30 ENCOUNTER — Ambulatory Visit
Admission: RE | Admit: 2017-04-30 | Discharge: 2017-04-30 | Disposition: A | Discharge status: Home / Self Care | Payer: Medicare Other | Source: Ambulatory Visit | Attending: Radiation Oncology | Admitting: Radiation Oncology

## 2017-04-30 DIAGNOSIS — C50412 Malignant neoplasm of upper-outer quadrant of left female breast: Secondary | ICD-10-CM | POA: Diagnosis not present

## 2017-05-01 ENCOUNTER — Ambulatory Visit
Admission: RE | Admit: 2017-05-01 | Discharge: 2017-05-01 | Disposition: A | Discharge status: Home / Self Care | Payer: Medicare Other | Source: Ambulatory Visit | Attending: Radiation Oncology | Admitting: Radiation Oncology

## 2017-05-01 DIAGNOSIS — C50412 Malignant neoplasm of upper-outer quadrant of left female breast: Secondary | ICD-10-CM | POA: Diagnosis not present

## 2017-05-02 ENCOUNTER — Ambulatory Visit
Admission: RE | Admit: 2017-05-02 | Discharge: 2017-05-02 | Disposition: A | Discharge status: Home / Self Care | Payer: Medicare Other | Source: Ambulatory Visit | Attending: Radiation Oncology | Admitting: Radiation Oncology

## 2017-05-02 ENCOUNTER — Ambulatory Visit: Payer: Medicare Other | Admitting: Radiation Oncology

## 2017-05-02 DIAGNOSIS — Z171 Estrogen receptor negative status [ER-]: Principal | ICD-10-CM

## 2017-05-02 DIAGNOSIS — C50412 Malignant neoplasm of upper-outer quadrant of left female breast: Secondary | ICD-10-CM

## 2017-05-02 MED ORDER — RADIAPLEXRX EX GEL
Freq: Once | CUTANEOUS | Status: AC
  Administered 2017-05-02: 17:00:00 via TOPICAL

## 2017-05-03 ENCOUNTER — Ambulatory Visit
Admission: RE | Admit: 2017-05-03 | Discharge: 2017-05-03 | Disposition: A | Discharge status: Home / Self Care | Payer: Medicare Other | Source: Ambulatory Visit | Attending: Radiation Oncology | Admitting: Radiation Oncology

## 2017-05-03 DIAGNOSIS — C50412 Malignant neoplasm of upper-outer quadrant of left female breast: Secondary | ICD-10-CM | POA: Diagnosis not present

## 2017-05-04 ENCOUNTER — Ambulatory Visit: Payer: Medicare Other

## 2017-05-05 ENCOUNTER — Ambulatory Visit: Payer: Medicare Other

## 2017-05-08 ENCOUNTER — Ambulatory Visit
Admission: RE | Admit: 2017-05-08 | Discharge: 2017-05-08 | Disposition: A | Discharge status: Home / Self Care | Payer: Medicare Other | Source: Ambulatory Visit | Attending: Radiation Oncology | Admitting: Radiation Oncology

## 2017-05-08 DIAGNOSIS — D0512 Intraductal carcinoma in situ of left breast: Secondary | ICD-10-CM | POA: Insufficient documentation

## 2017-05-08 DIAGNOSIS — Z170 Estrogen receptor positive status [ER+]: Secondary | ICD-10-CM | POA: Diagnosis not present

## 2017-05-08 DIAGNOSIS — C50411 Malignant neoplasm of upper-outer quadrant of right female breast: Secondary | ICD-10-CM | POA: Insufficient documentation

## 2017-05-08 DIAGNOSIS — Z510 Encounter for antineoplastic radiation therapy: Secondary | ICD-10-CM | POA: Diagnosis present

## 2017-05-08 DIAGNOSIS — Z79811 Long term (current) use of aromatase inhibitors: Secondary | ICD-10-CM | POA: Insufficient documentation

## 2017-05-08 DIAGNOSIS — Z79899 Other long term (current) drug therapy: Secondary | ICD-10-CM | POA: Diagnosis not present

## 2017-05-09 ENCOUNTER — Ambulatory Visit
Admission: RE | Admit: 2017-05-09 | Discharge: 2017-05-09 | Disposition: A | Discharge status: Home / Self Care | Payer: Medicare Other | Source: Ambulatory Visit | Attending: Radiation Oncology | Admitting: Radiation Oncology

## 2017-05-09 DIAGNOSIS — Z510 Encounter for antineoplastic radiation therapy: Secondary | ICD-10-CM | POA: Diagnosis not present

## 2017-05-10 ENCOUNTER — Ambulatory Visit: Payer: Medicare Other

## 2017-05-10 ENCOUNTER — Ambulatory Visit
Admission: RE | Admit: 2017-05-10 | Discharge: 2017-05-10 | Disposition: A | Discharge status: Home / Self Care | Payer: Medicare Other | Source: Ambulatory Visit | Attending: Radiation Oncology | Admitting: Radiation Oncology

## 2017-05-10 DIAGNOSIS — Z510 Encounter for antineoplastic radiation therapy: Secondary | ICD-10-CM | POA: Diagnosis not present

## 2017-05-11 ENCOUNTER — Ambulatory Visit
Admission: RE | Admit: 2017-05-11 | Discharge: 2017-05-11 | Disposition: A | Discharge status: Home / Self Care | Payer: Medicare Other | Source: Ambulatory Visit | Attending: Radiation Oncology | Admitting: Radiation Oncology

## 2017-05-11 ENCOUNTER — Ambulatory Visit (HOSPITAL_BASED_OUTPATIENT_CLINIC_OR_DEPARTMENT_OTHER): Payer: Medicare Other | Admitting: Hematology and Oncology

## 2017-05-11 ENCOUNTER — Ambulatory Visit: Payer: Medicare Other

## 2017-05-11 ENCOUNTER — Telehealth: Payer: Self-pay | Admitting: Hematology and Oncology

## 2017-05-11 DIAGNOSIS — C50411 Malignant neoplasm of upper-outer quadrant of right female breast: Secondary | ICD-10-CM | POA: Diagnosis not present

## 2017-05-11 DIAGNOSIS — D0502 Lobular carcinoma in situ of left breast: Secondary | ICD-10-CM | POA: Diagnosis not present

## 2017-05-11 DIAGNOSIS — Z510 Encounter for antineoplastic radiation therapy: Secondary | ICD-10-CM | POA: Diagnosis not present

## 2017-05-11 DIAGNOSIS — L598 Other specified disorders of the skin and subcutaneous tissue related to radiation: Secondary | ICD-10-CM | POA: Diagnosis not present

## 2017-05-11 DIAGNOSIS — C773 Secondary and unspecified malignant neoplasm of axilla and upper limb lymph nodes: Secondary | ICD-10-CM | POA: Diagnosis not present

## 2017-05-11 DIAGNOSIS — Z170 Estrogen receptor positive status [ER+]: Secondary | ICD-10-CM | POA: Diagnosis not present

## 2017-05-11 MED ORDER — ANASTROZOLE 1 MG PO TABS: 1.0000 mg | ORAL_TABLET | Freq: Every day | ORAL | 3 refills | Status: DC

## 2017-05-11 NOTE — Telephone Encounter (Signed)
Gave patient AVS and calendar of upcoming April appointments.  °

## 2017-05-11 NOTE — Progress Notes (Signed)
Patient Care Team: Nolene Ebbs, MD as PCP - General (Internal Medicine) Stark Klein, MD as Consulting Physician (General Surgery) Nicholas Lose, MD as Consulting Physician (Hematology and Oncology) Gery Pray, MD as Consulting Physician (Radiation Oncology)  DIAGNOSIS:  Encounter Diagnosis  Name Primary?  . Malignant neoplasm of upper-outer quadrant of right breast in female, estrogen receptor positive ([place])     SUMMARY OF ONCOLOGIC HISTORY:   Malignant neoplasm of upper-outer quadrant of right breast in female, estrogen receptor positive ([place])   10/28/2016 Initial Diagnosis    Screening detected right breast distortion at UOQ 10:00: 2.4 cm with abnormal lymph node in the axilla, biopsy invasive lobular cancer with LCIS ER 90%, PR 80%, Ki-67 10%, HER-2 negative ratio 1.48, lymph node biopsy also positive T2 N0 stage II a (New AJCC)      10/28/2016 Miscellaneous    Mammaprint low-risk luminal type A      11/28/2016 Breast MRI    malignancy in the retroareolar upper outer quadrant of the right breast 1.7 x 1.0 x 1.5 cm single right axillary lymph node, Suspicious clumped linear enhancement in the upper-outer quadrant of the LEFT breast      01/24/2017 Surgery    Bilateral lumpectomies: Left: DCIS grade 3, 3.6 cm, 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, with microinvasion, margins negative, 1/2 lymph nodes positive ER 0%, PR 0% Her 2 Positive; right: ILC grade 2, 1.8 cm, margin of resection focally positive anterior cauterized margin, 2/3 lymph nodes positive, ER 90%, PR 80%, HER-2 negative, Ki-67 10%       Chemotherapy    Patient refused chemotherapy for the HER-2 positive breast cancer       03/29/2017 - 05/11/2017 Radiation Therapy    Adjuvant radiation therapy       CHIEF COMPLIANT: Follow-up on radiation therapy  INTERVAL HISTORY: Sharon Reid is a 66 year old with above-mentioned history of bilateral lumpectomies for breast cancer currently in adjuvant radiation.  She has a few more days  of radiation left.  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  REVIEW OF SYSTEMS:   Constitutional: Denies fevers, chills or abnormal weight loss Eyes: Denies blurriness of vision Ears, nose, mouth, throat, and face: Denies mucositis or sore throat Respiratory: Denies cough, dyspnea or wheezes Cardiovascular: Denies palpitation, chest discomfort Gastrointestinal:  Denies nausea, heartburn or change in bowel habits Skin: Denies abnormal skin rashes Lymphatics: Denies new lymphadenopathy or easy bruising Neurological:Denies numbness, tingling or new weaknesses Behavioral/Psych: Mood is stable, no new changes  Extremities: No lower extremity edema Breast: Radiation dermatitis All other systems were reviewed with the patient and are negative.  I have reviewed the past medical history, past surgical history, social history and family history with the patient and they are unchanged from previous note.  ALLERGIES:  is allergic to other.  MEDICATIONS:  Current Outpatient Medications  Medication Sig Dispense Refill  . acetaminophen (TYLENOL) 500 MG tablet Take 250 mg by mouth every 6 (six) hours as needed (for pain/headaches.).     Marland Kitchen amLODipine (NORVASC) 5 MG tablet Take 5 mg by mouth every evening.    Marland Kitchen anastrozole (ARIMIDEX) 1 MG tablet Take 1 tablet (1 mg total) by mouth daily. 90 tablet 3  . hyaluronate sodium (RADIAPLEXRX) GEL Apply 1 application topically 2 (two) times daily.    . non-metallic deodorant Jethro Poling) MISC Apply 1 application topically daily as needed.    Marland Kitchen oxyCODONE (OXY IR/ROXICODONE) 5 MG immediate release tablet Take 1-2 tablets (5-10 mg total) by mouth every 6 (six)  hours as needed for moderate pain, severe pain or breakthrough pain. (Patient not taking: Reported on 02/08/2017) 20 tablet 0  . sodium chloride (OCEAN) 0.65 % SOLN nasal spray Place 1 spray into both nostrils 3 (three) times daily as needed for congestion.     No current facility-administered  medications for this visit.     PHYSICAL EXAMINATION: ECOG PERFORMANCE STATUS: 1 - Symptomatic but completely ambulatory  Vitals:   05/11/17 1526  BP: (!) 152/102  Pulse: 92  Resp: 18  Temp: 98.4 F (36.9 C)  SpO2: 98%   Filed Weights   05/11/17 1526  Weight: 259 lb 14.4 oz (117.9 kg)    GENERAL:alert, no distress and comfortable SKIN: skin color, texture, turgor are normal, no rashes or significant lesions EYES: normal, Conjunctiva are pink and non-injected, sclera clear OROPHARYNX:no exudate, no erythema and lips, buccal mucosa, and tongue normal  NECK: supple, thyroid normal size, non-tender, without nodularity LYMPH:  no palpable lymphadenopathy in the cervical, axillary or inguinal LUNGS: clear to auscultation and percussion with normal breathing effort HEART: regular rate & rhythm and no murmurs and no lower extremity edema ABDOMEN:abdomen soft, non-tender and normal bowel sounds MUSCULOSKELETAL:no cyanosis of digits and no clubbing  NEURO: alert & oriented x 3 with fluent speech, no focal motor/sensory deficits EXTREMITIES: No lower extremity edema  LABORATORY DATA:  I have reviewed the data as listed   Chemistry      Component Value Date/Time   NA 141 02/14/2017 0603   NA 142 11/26/202018 0847   K 3.8 02/14/2017 0603   K 3.9 11/26/202018 0847   CL 110 02/14/2017 0603   CO2 25 02/14/2017 0603   CO2 27 11/26/202018 0847   BUN 12 02/14/2017 0603   BUN 9.4 11/26/202018 0847   CREATININE 0.80 02/14/2017 0603   CREATININE 0.8 11/26/202018 0847      Component Value Date/Time   CALCIUM 9.9 02/14/2017 0603   CALCIUM 10.4 11/26/202018 0847   ALKPHOS 109 11/26/202018 0847   AST 21 11/26/202018 0847   ALT 25 11/26/202018 0847   BILITOT 0.45 11/26/202018 0847       Lab Results  Component Value Date   WBC 8.2 02/14/2017   HGB 11.9 (L) 02/14/2017   HCT 37.7 02/14/2017   MCV 92.4 02/14/2017   PLT 212 02/14/2017   NEUTROABS 5.3 11/26/202018    ASSESSMENT & PLAN:  Malignant  neoplasm of upper-outer quadrant of right breast in female, estrogen receptor positive ([place]) 08/14/2018Bilateral lumpectomies: Left: DCIS grade 3, 3.6 cm, 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, with microinvasion, margins negative, 1/2 lymph nodes positive ER 0%, PR 0%; HER 2 Positive right: ILC grade 2, 1.8 cm, margin of resection focally positive anterior cauterized margin, 2/3 lymph nodes positive, ER 90%, PR 80%, HER-2 negative, Ki-67 10%  Mammaprint on the right side: Low risk luminal type a Patient refused chemotherapy for the HER-2 positive breast cancer  Adjuvant radiation therapy completed 05/11/2017  Treatment plan: Adjuvant antiestrogen therapy with letrozole 2.5 mg daily starting January 2019 Patient had taken anastrozole prior to her surgery and also risks and benefits. Sent a new prescription today.  Return to clinic in April 2019 for survivorship care plan visit  I spent 25 minutes talking to the patient of which more than half was spent in counseling and coordination of care.  No orders of the defined types were placed in this encounter.  The patient has a good understanding of the overall plan. she agrees with it. she will call with any  problems that may develop before the next visit here.   Rulon Eisenmenger, MD 05/11/17

## 2017-05-11 NOTE — Assessment & Plan Note (Signed)
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

## 2017-05-12 ENCOUNTER — Ambulatory Visit: Payer: Medicare Other

## 2017-05-12 ENCOUNTER — Ambulatory Visit
Admission: RE | Admit: 2017-05-12 | Discharge: 2017-05-12 | Disposition: A | Discharge status: Home / Self Care | Payer: Medicare Other | Source: Ambulatory Visit | Attending: Radiation Oncology | Admitting: Radiation Oncology

## 2017-05-12 DIAGNOSIS — Z510 Encounter for antineoplastic radiation therapy: Secondary | ICD-10-CM | POA: Diagnosis not present

## 2017-05-15 ENCOUNTER — Ambulatory Visit: Payer: Medicare Other

## 2017-05-15 ENCOUNTER — Ambulatory Visit
Admission: RE | Admit: 2017-05-15 | Discharge: 2017-05-15 | Disposition: A | Discharge status: Home / Self Care | Payer: Medicare Other | Source: Ambulatory Visit | Attending: Radiation Oncology | Admitting: Radiation Oncology

## 2017-05-15 DIAGNOSIS — Z510 Encounter for antineoplastic radiation therapy: Secondary | ICD-10-CM | POA: Diagnosis not present

## 2017-05-16 ENCOUNTER — Ambulatory Visit
Admission: RE | Admit: 2017-05-16 | Discharge: 2017-05-16 | Disposition: A | Discharge status: Home / Self Care | Payer: Medicare Other | Source: Ambulatory Visit | Attending: Radiation Oncology | Admitting: Radiation Oncology

## 2017-05-16 ENCOUNTER — Ambulatory Visit: Payer: Medicare Other

## 2017-05-16 DIAGNOSIS — Z510 Encounter for antineoplastic radiation therapy: Secondary | ICD-10-CM | POA: Diagnosis not present

## 2017-05-16 DIAGNOSIS — C50412 Malignant neoplasm of upper-outer quadrant of left female breast: Secondary | ICD-10-CM

## 2017-05-16 DIAGNOSIS — Z171 Estrogen receptor negative status [ER-]: Principal | ICD-10-CM

## 2017-05-16 MED ORDER — RADIAPLEXRX EX GEL
Freq: Once | CUTANEOUS | Status: AC
  Administered 2017-05-16: 15:00:00 via TOPICAL

## 2017-05-17 ENCOUNTER — Ambulatory Visit: Payer: Medicare Other

## 2017-05-17 ENCOUNTER — Ambulatory Visit
Admission: RE | Admit: 2017-05-17 | Discharge: 2017-05-17 | Disposition: A | Discharge status: Home / Self Care | Payer: Medicare Other | Source: Ambulatory Visit | Attending: Radiation Oncology | Admitting: Radiation Oncology

## 2017-05-17 ENCOUNTER — Encounter: Payer: Self-pay | Admitting: Radiation Oncology

## 2017-05-17 DIAGNOSIS — Z510 Encounter for antineoplastic radiation therapy: Secondary | ICD-10-CM | POA: Diagnosis not present

## 2017-05-18 ENCOUNTER — Ambulatory Visit: Payer: Medicare Other

## 2017-05-24 NOTE — Progress Notes (Signed)
  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     Indication for treatment:  Curative       Radiation treatment dates:   03/29/17 - 05/17/17  Site/dose:    Left Breast 4 field // 50.4 Gy in 28 fx     axilla       45 Gy in 25 fx  Right Breast 4 field// 50.4 Gy in28 fx   axilla          45 Gy in 25 fx  Right Breast boost// 10 Gy in 5 fx Left Breast boost // 12 Gy in 6 fx  Beams/energy:   Photon // 3D  Narrative: The patient tolerated radiation treatment relatively well.  Patient reports having occasional sharp pains in both breast as well as her lower back, she has been taking 2 tablets of Tylenol before treatments.She reports having fatigue and that pain is keeping her up at night. The skin on both breast and both sides of her neck have hyperpigmentation.Patient reports a slight sting on breast, has been using Radiplex as directed.  Plan: The patient has completed radiation treatment. The patient will return to radiation oncology clinic for routine followup in one month. I advised them to call or return sooner if they have any questions or concerns related to their recovery or treatment.  -----------------------------------  [name], PhD, MD  This document serves as a record of services personally performed by Gery Pray MD. It was created on his behalf by Delton Coombes, a trained medical scribe. The creation of this record is based on the scribe's personal observations and the provider's statements to them.

## 2017-06-21 ENCOUNTER — Encounter: Payer: Self-pay | Admitting: Oncology

## 2017-06-22 ENCOUNTER — Encounter: Payer: Self-pay | Admitting: Radiation Oncology

## 2017-06-22 ENCOUNTER — Other Ambulatory Visit: Payer: Self-pay

## 2017-06-22 ENCOUNTER — Ambulatory Visit
Admission: RE | Admit: 2017-06-22 | Discharge: 2017-06-22 | Disposition: A | Discharge status: Home / Self Care | Payer: Medicare Other | Source: Ambulatory Visit | Attending: Radiation Oncology | Admitting: Radiation Oncology

## 2017-06-22 VITALS — BP 137/98 | HR 80 | Temp 98.5°F | Resp 20 | Wt 257.4 lb

## 2017-06-22 DIAGNOSIS — Z510 Encounter for antineoplastic radiation therapy: Secondary | ICD-10-CM | POA: Diagnosis not present

## 2017-06-22 DIAGNOSIS — C50411 Malignant neoplasm of upper-outer quadrant of right female breast: Secondary | ICD-10-CM

## 2017-06-22 DIAGNOSIS — C50412 Malignant neoplasm of upper-outer quadrant of left female breast: Secondary | ICD-10-CM

## 2017-06-22 DIAGNOSIS — Z170 Estrogen receptor positive status [ER+]: Principal | ICD-10-CM

## 2017-06-22 NOTE — Progress Notes (Addendum)
Radiation Oncology         [phone] ________________________________  Name: Sharon Reid MRN: [id number]  Date: 06/22/2017  DOB: 24-Aug-1950  Follow-Up Visit Note  CC: Sharon Ebbs, MD  Sharon Lose, MD    ICD-10-CM   1. 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     Interval Since Last Radiation:  1 month  03/29/17 - 05/17/17:   Left Breast 4 field // 50.4 Gy in 28 fx                           axilla       45 Gy in 25 fx  Right Breast 4 field// 50.4 Gy in28 fx                         axilla          45 Gy in 25 fx  Right Breast boost// 10 Gy in 5 fx Left Breast boost // 12 Gy in 6 fx    Narrative:  The patient returns today for routine follow-up. She has been doing well overall since completing treatment. She did have some mild dry desquamation of bilateral supraclavicular sites.  She reports that her skin is well healed at this time. No other complaints are noted.          ALLERGIES:  is allergic to other.  Meds: Current Outpatient Medications  Medication Sig Dispense Refill  . acetaminophen (TYLENOL) 500 MG tablet Take 250 mg by mouth every 6 (six) hours as needed (for pain/headaches.).     Marland Kitchen amLODipine (NORVASC) 5 MG tablet Take 5 mg by mouth every evening.    Marland Kitchen anastrozole (ARIMIDEX) 1 MG tablet Take 1 tablet (1 mg total) by mouth daily. 90 tablet 3  . non-metallic deodorant (ALRA) MISC Apply 1 application topically daily as needed.    . sodium chloride (OCEAN) 0.65 % SOLN nasal spray Place 1 spray into both nostrils 3 (three) times daily as needed for congestion.    . hyaluronate sodium (RADIAPLEXRX) GEL Apply 1 application topically 2 (two) times daily.    Marland Kitchen oxyCODONE (OXY  IR/ROXICODONE) 5 MG immediate release tablet Take 1-2 tablets (5-10 mg total) by mouth every 6 (six) hours as needed for moderate pain, severe pain or breakthrough pain. (Patient not taking: Reported on 02/08/2017) 20 tablet 0   No current facility-administered medications for this encounter.     Physical Findings:  weight is 257 lb 6 oz (116.7 kg). Her oral temperature is 98.5 F (36.9 C). Her blood pressure is 137/98 (abnormal) and her pulse is 80. Her respiration is 20 and oxygen saturation is 98%.   In general this is a well appearing African American female in no acute distress. She's alert and oriented x4 and appropriate throughout the examination. Cardiopulmonary assessment is negative for acute distress and she exhibits normal effort. Bilateral breasts are healing well with mild hyperpigmentation, there is more prominent hyperpigmentation noted in the supraclavicular regions bilaterally.    Lab Findings: Lab Results  Component Value Date   WBC 8.2 02/14/2017   HGB 11.9 (L) 02/14/2017   HCT 37.7 02/14/2017   MCV  92.4 02/14/2017   PLT 212 02/14/2017    Radiographic Findings: No results found.  Impression/Plan:  1. 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 The patient has been doing well since completion of radiotherapy. We discussed that we would be happy to continue to follow her as needed, but she will also continue to follow up with Dr. Lindi Adie in medical oncology. She was counseled on skin care as well as measures to avoid sun exposure to this area.  2. Survivorship. The patient was counseled on the rationale for survivorship evaluation.      Sharon Reid, PAC Seen with  Sharon Promise, PhD, MD  This document serves as a record of services personally performed by Sharon Pray, MD. It was created on his behalf by Sharon Reid, a  trained medical scribe. The creation of this record is based on the scribe's personal observations and the provider's statements to them. This document has been checked and approved by the attending provider.

## 2017-09-06 ENCOUNTER — Telehealth: Payer: Self-pay

## 2017-09-06 NOTE — Telephone Encounter (Signed)
LVM for patient reminding of SCP visit with [facility] on 09/11/17 at 2 pm.  Center number left for call back with questions/concerns.

## 2017-09-11 ENCOUNTER — Encounter: Payer: Medicare Other | Admitting: Adult Health

## 2017-09-12 ENCOUNTER — Inpatient Hospital Stay: Payer: Medicare Other | Attending: Adult Health | Admitting: Adult Health

## 2017-09-12 ENCOUNTER — Telehealth: Payer: Self-pay | Admitting: Adult Health

## 2017-09-12 ENCOUNTER — Encounter: Payer: Self-pay | Admitting: Adult Health

## 2017-09-12 VITALS — BP 143/90 | HR 84 | Temp 98.2°F | Resp 18 | Ht 65.0 in | Wt 258.2 lb

## 2017-09-12 DIAGNOSIS — Z79899 Other long term (current) drug therapy: Secondary | ICD-10-CM | POA: Insufficient documentation

## 2017-09-12 DIAGNOSIS — Z923 Personal history of irradiation: Secondary | ICD-10-CM | POA: Insufficient documentation

## 2017-09-12 DIAGNOSIS — Z79811 Long term (current) use of aromatase inhibitors: Secondary | ICD-10-CM | POA: Diagnosis not present

## 2017-09-12 DIAGNOSIS — C773 Secondary and unspecified malignant neoplasm of axilla and upper limb lymph nodes: Secondary | ICD-10-CM | POA: Diagnosis not present

## 2017-09-12 DIAGNOSIS — D0512 Intraductal carcinoma in situ of left breast: Secondary | ICD-10-CM | POA: Diagnosis not present

## 2017-09-12 DIAGNOSIS — Z170 Estrogen receptor positive status [ER+]: Secondary | ICD-10-CM | POA: Insufficient documentation

## 2017-09-12 DIAGNOSIS — C50411 Malignant neoplasm of upper-outer quadrant of right female breast: Secondary | ICD-10-CM | POA: Diagnosis not present

## 2017-09-12 DIAGNOSIS — C50412 Malignant neoplasm of upper-outer quadrant of left female breast: Secondary | ICD-10-CM

## 2017-09-12 NOTE — Telephone Encounter (Signed)
Gave patient AVs and calendar of upcoming October appointments.

## 2017-09-12 NOTE — Progress Notes (Signed)
CLINIC:  Survivorship   REASON FOR VISIT:  Routine follow-up post-treatment for a recent history of breast cancer.  BRIEF ONCOLOGIC HISTORY:    Malignant neoplasm of upper-outer quadrant of right breast in female, estrogen receptor positive ([place])   10/28/2016 Initial Diagnosis    Screening detected right breast distortion at UOQ 10:00: 2.4 cm with abnormal lymph node in the axilla, biopsy invasive lobular cancer with LCIS ER 90%, PR 80%, Ki-67 10%, HER-2 negative ratio 1.48, lymph node biopsy also positive T2 N0 stage II a (New AJCC)      10/28/2016 Miscellaneous    Mammaprint low-risk luminal type A      11/28/2016 Breast MRI    malignancy in the retroareolar upper outer quadrant of the right breast 1.7 x 1.0 x 1.5 cm single right axillary lymph node, Suspicious clumped linear enhancement in the upper-outer quadrant of the LEFT breast      01/24/2017 Surgery    Bilateral lumpectomies: Left: DCIS grade 3, 3.6 cm, 4.8 cm, with microinvasion, margins negative, 1/2 lymph nodes positive ER 0%, PR 0% Her 2 Positive; right: ILC grade 2, 1.8 cm, margin of resection focally positive anterior cauterized margin, 2/3 lymph nodes positive, ER 90%, PR 80%, HER-2 negative, Ki-67 10%       Chemotherapy    Patient refused chemotherapy for the HER-2 positive breast cancer       03/29/2017 - 05/11/2017 Radiation Therapy    Adjuvant radiation therapy      06/2017 -  Anti-estrogen oral therapy    Letrozole daily       INTERVAL HISTORY:  Ms. Jeffords presents to the [facility] today for our initial meeting to review her survivorship care plan detailing her treatment course for breast cancer, as well as monitoring long-term side effects of that treatment, education regarding health maintenance, screening, and overall wellness and health promotion.     Overall, Ms. Matsumura reports feeling quite well.  She has some mild tenderness at her surgical site, but this is intermittent and she is  managing it well.  She is taking Letrozole daily and is tolerating it well.      REVIEW OF SYSTEMS:  Review of Systems  Constitutional: Negative for appetite change, chills, fatigue, fever and unexpected weight change.  HENT:   Negative for hearing loss, lump/mass and trouble swallowing.   Eyes: Negative for eye problems and icterus.  Respiratory: Negative for chest tightness, cough and shortness of breath.   Cardiovascular: Negative for chest pain, leg swelling and palpitations.  Gastrointestinal: Negative for abdominal distention, abdominal pain, constipation, diarrhea, nausea and vomiting.  Endocrine: Negative for hot flashes.  Musculoskeletal: Positive for back pain (chronic).  Skin: Negative for itching and rash.  Neurological: Negative for dizziness, extremity weakness, headaches and numbness.  Hematological: Negative for adenopathy. Does not bruise/bleed easily.  Psychiatric/Behavioral: Negative for depression. The patient is not nervous/anxious.   Breast: Denies any new nodularity, masses, tenderness, nipple changes, or nipple discharge.      ONCOLOGY TREATMENT TEAM:  1. Surgeon:  Dr. Barry Dienes at [facility] 2. Medical Oncologist: Dr. Lindi Adie  3. Radiation Oncologist: Dr. Sondra Come    PAST MEDICAL/SURGICAL HISTORY:  Past Medical History:  Diagnosis Date  . Breast cancer ([place]) 10/31/2016   patient was notifed today!!  . GERD (gastroesophageal reflux disease)   . History of radiation therapy 03/29/17-05/17/17   left breast 4 field 50.4 Gy in 28 fractions, left axilla 45 Gy in 25 fx, right breast 4 field 50.4  Gy in 28 fractions, right axilla 45 Gy in 25 fx, left breast boost 12 Gy in 6 fx, right breast boost 10 Gy in 5 fractions  . Hypertension   . Refusal of blood transfusions as patient is Jehovah's Witness   . Rib fracture 2016   came from a fall   Past Surgical History:  Procedure Laterality Date  . BREAST BIOPSY  10/25/2016  . DILATION AND CURETTAGE OF  UTERUS    . RADIOACTIVE SEED GUIDED PARTIAL MASTECTOMY/AXILLARY SENTINEL NODE BIOPSY/AXILLARY NODE DISSECTION Bilateral 01/24/2017   Procedure: RIGHT SEED LOCALIZED LUMPECTOMY AND SEED TARGETED SENTINEL LYMPH NODE BIOPSY, LEFT BREAST SEED BRACKETED LUMPECTOMY AND SENTINEL LYMPH NODE BIOPSY;  Surgeon: Stark Klein, MD;  Location: [facility];  Service: General;  Laterality: Bilateral;  . RE-EXCISION OF BREAST LUMPECTOMY Right 02/14/2017   Procedure: RE-EXCISION RIGHT BREAST LUMPECTOMY;  Surgeon: Stark Klein, MD;  Location: [facility];  Service: General;  Laterality: Right;     ALLERGIES:  Allergies  Allergen Reactions  . Other Other (See Comments)    Perfume gives headache     CURRENT MEDICATIONS:  Outpatient Encounter Medications as of 09/12/2017  Medication Sig  . acetaminophen (TYLENOL) 500 MG tablet Take 250 mg by mouth every 6 (six) hours as needed (for pain/headaches.).   Marland Kitchen amLODipine (NORVASC) 5 MG tablet Take 5 mg by mouth every evening.  Marland Kitchen anastrozole (ARIMIDEX) 1 MG tablet Take 1 tablet (1 mg total) by mouth daily.  . non-metallic deodorant Jethro Poling) MISC Apply 1 application topically daily as needed.  . sodium chloride (OCEAN) 0.65 % SOLN nasal spray Place 1 spray into both nostrils 3 (three) times daily as needed for congestion.  . [DISCONTINUED] hyaluronate sodium (RADIAPLEXRX) GEL Apply 1 application topically 2 (two) times daily.  . [DISCONTINUED] oxyCODONE (OXY IR/ROXICODONE) 5 MG immediate release tablet Take 1-2 tablets (5-10 mg total) by mouth every 6 (six) hours as needed for moderate pain, severe pain or breakthrough pain. (Patient not taking: Reported on 02/08/2017)   No facility-administered encounter medications on file as of 09/12/2017.      ONCOLOGIC FAMILY HISTORY:  Family History  Problem Relation Age of Onset  . Breast cancer Neg Hx   . Colon cancer Neg Hx       SOCIAL HISTORY:  Social History   Socioeconomic History  . Marital status: Divorced    Spouse name:  Not on file  . Number of children: Not on file  . Years of education: Not on file  . Highest education level: Not on file  Occupational History  . Occupation: retired  Scientific laboratory technician  . Financial resource strain: Not on file  . Food insecurity:    Worry: Not on file    Inability: Not on file  . Transportation needs:    Medical: Not on file    Non-medical: Not on file  Tobacco Use  . Smoking status: Never Smoker  . Smokeless tobacco: Never Used  Substance and Sexual Activity  . Alcohol use: No  . Drug use: No  . Sexual activity: Not on file  Lifestyle  . Physical activity:    Days per week: Not on file    Minutes per session: Not on file  . Stress: Not on file  Relationships  . Social connections:    Talks on phone: Not on file    Gets together: Not on file    Attends religious service: Not on file    Active member of club or organization: Not on  file    Attends meetings of clubs or organizations: Not on file    Relationship status: Not on file  . Intimate partner violence:    Fear of current or ex partner: Not on file    Emotionally abused: Not on file    Physically abused: Not on file    Forced sexual activity: Not on file  Other Topics Concern  . Not on file  Social History Narrative  . Not on file      PHYSICAL EXAMINATION:  Vital Signs:   Vitals:   09/12/17 1039  BP: (!) 143/90  Pulse: 84  Resp: 18  Temp: 98.2 F (36.8 C)  SpO2: 97%   Filed Weights   09/12/17 1039  Weight: 258 lb 3.2 oz (117.1 kg)   General: Well-nourished, well-appearing female in no acute distress.  She is unaccompanied today.   HEENT: Head is normocephalic.  Pupils equal and reactive to light. Conjunctivae clear without exudate.  Sclerae anicteric. Oral mucosa is pink, moist.  Oropharynx is pink without lesions or erythema.  Lymph: No cervical, supraclavicular, or infraclavicular lymphadenopathy noted on palpation.  Cardiovascular: Regular rate and rhythm.Marland Kitchen Respiratory: Clear to  auscultation bilaterally. Chest expansion symmetric; breathing non-labored.  Breasts: s/p bilateral lumpectomies and radiation.  There are no nodules, masses, skin or nipple changes.   GI: Abdomen soft and round; non-tender, non-distended. Bowel sounds normoactive.  GU: Deferred.  Neuro: No focal deficits. Steady gait.  Psych: Mood and affect normal and appropriate for situation.  Extremities: No edema. MSK: No focal spinal tenderness to palpation.  Full range of motion in bilateral upper extremities Skin: Warm and dry.  LABORATORY DATA:  None for this visit.  DIAGNOSTIC IMAGING:  None for this visit.      ASSESSMENT AND PLAN:  Ms.. Tyndall is a pleasant 66 y.o. 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  She presents to the Survivorship Clinic for our initial meeting and routine follow-up post-completion of treatment for breast cancer.    1. Stage IA bilateral breast cancer:  Ms. Hoon is continuing to recover from definitive treatment for breast cancer. She will follow-up with her medical oncologist, Dr. Lindi Adie in 6 months with history and physical exam per surveillance protocol.  She will continue her anti-estrogen therapy with Letrozole. Thus far, she is tolerating the Letrozole well, with minimal side effects. She was instructed to make Dr. Lindi Adie or myself aware if she begins to experience any worsening side effects of the medication and I could see her back in clinic to help manage those side effects, as needed. Today I also ordered her mammogram due in May, 2019. Today, a comprehensive survivorship care plan and treatment summary was reviewed with the  patient today detailing her breast cancer diagnosis, treatment course, potential late/long-term effects of treatment, appropriate follow-up care with recommendations for the future, and patient education resources.  A copy of this summary, along with a letter will be sent to the patient's primary care provider via mail/fax/In Basket message after today's visit.    2. Bone health:  Given Ms. Shaheed's age/history of breast cancer and her current treatment regimen including anti-estrogen therapy withLetrozole, she is at risk for bone demineralization.  Her last DEXA scan was 10/18/2016, which showed  a normal T score of -0.8 in the left femur.  She was encouraged to increase her consumption of foods rich in calcium, as well as increase her weight-bearing activities.  She was given education on specific activities to promote bone health.  3. Cancer screening:  Due to Ms. Kluender's history and her age, she should receive screening for skin cancers, colon cancer, and gynecologic cancers.  The information and recommendations are listed on the patient's comprehensive care plan/treatment summary and were reviewed in detail with the patient.    4. Health maintenance and wellness promotion: Ms. Mellette was encouraged to consume 5-7 servings of fruits and vegetables per day. We reviewed the "Nutrition Rainbow" handout, as well as the handout "Take Control of Your Health and Reduce Your Cancer Risk" from the [facility].  She was also encouraged to engage in moderate to vigorous exercise for 30 minutes per day most days of the week. We discussed the [employer] fitness program, which is designed for cancer survivors to help them become more physically fit after cancer treatments.  She was instructed to limit her alcohol consumption and continue to abstain from tobacco use.     5. Support services/counseling: It is not uncommon for this period of the patient's cancer care trajectory to be one of many emotions  and stressors.  We discussed an opportunity for her to participate in the next session of [facility] ("Finding Your New Normal") support group series designed for patients after they have completed treatment.   Ms. Lopresti was encouraged to take advantage of our many other support services programs, support groups, and/or counseling in coping with her new life as a cancer survivor after completing anti-cancer treatment.  She was offered support today through active listening and expressive supportive counseling.  She was given information regarding our available services and encouraged to contact me with any questions or for help enrolling in any of our support group/programs.    Dispo:   -Return to cancer center in 6 months for f/u with Dr. Lindi Adie -Mammogram due in 10/2017 -Bone Density 10/2018 -Follow up with Dr. Barry Dienes in 1 year  -She is welcome to return back to the Survivorship Clinic at any time; no additional follow-up needed at this time.  -Consider referral back to survivorship as a long-term survivor for continued surveillance  A total of (30) minutes of face-to-face time was spent with this patient with greater than 50% of that time in counseling and care-coordination.   Gardenia Phlegm, NP Survivorship Program [facility] [phone]   Note: PRIMARY CARE PROVIDER Nolene Ebbs, [place] [phone]

## 2018-01-02 ENCOUNTER — Other Ambulatory Visit: Payer: Self-pay | Admitting: Internal Medicine

## 2018-01-02 DIAGNOSIS — E2839 Other primary ovarian failure: Secondary | ICD-10-CM

## 2018-02-01 IMAGING — US US BREAST BX W LOC DEV 1ST LESION IMG BX SPEC US GUIDE*R*
1 series · 13 of 19 positions shown · non-contrast
Comparison: Previous exam(s).

ADDENDUM:
Pathology revealed grade II invasive lobular carcinoma and lobular
carcinoma in situ in the right breast and metastatic carcinoma in
the right axillary lymph node. The left breast showed fibrocystic
change including apocrine metaplasia, fat necrosis, fibrosis and
calcifications. This was found to be concordant by Dr. Flegar
Liza.

Pathology results were discussed with the patient by telephone. The
patient reported doing well after the biopsies with tenderness at
the sites. Post biopsy instructions and care were reviewed and
questions were answered. The patient was encouraged to call The
patient was referred to [REDACTED] [REDACTED] at the [HOSPITAL] [HOSPITAL] on November 09, 2016.
Pathology results reported by Louis Anthony Saing RN, BSN on 11/01/2016.
CLINICAL DATA: 66-year-old presenting with screening detected
bilateral breast masses. At diagnostic workup, an approximate 2.7 cm
mass associated with architectural distortion was identified in the
upper outer right quadrant of the breast at the 10 o'clock position
approximately 1 cm from the nipple. A solitary pathologic right
axillary lymph node was identified with cortical thickening up to 6
mm. A 1.5 cm mass was identified in the upper outer quadrant of the
left breast at the 2 o'clock position approximately 4 cm from the
nipple. Biopsy of each is performed today.
EXAM:
ULTRASOUND GUIDED RIGHT BREAST CORE NEEDLE BIOPSY

[Series 2: us breast bx w loc dev 1st lesion img bx spec us g · 0.07mm/px · 13 of 19 slices shown]
[im 1/19]
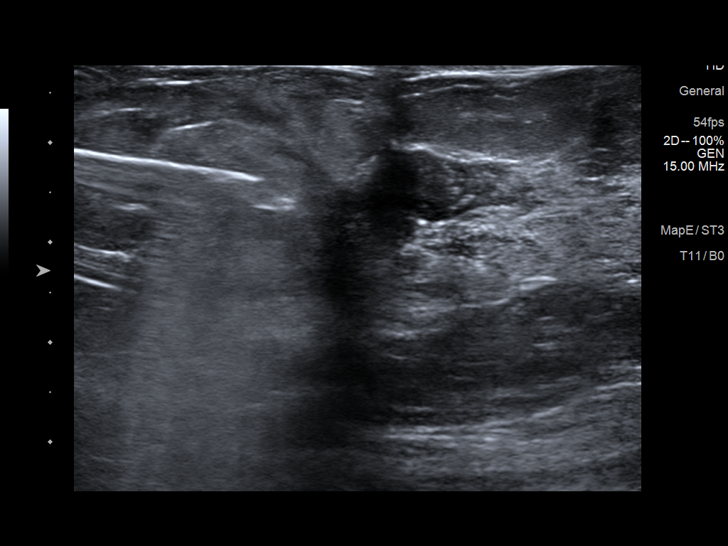
[im 3/19]
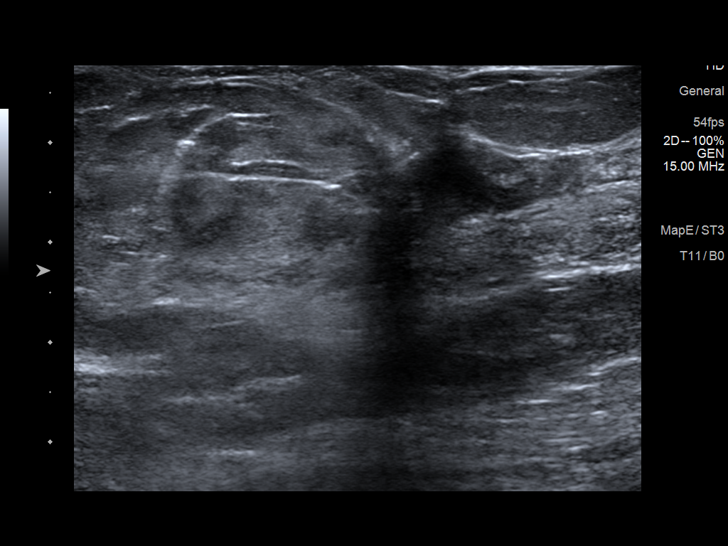
[im 4/19]
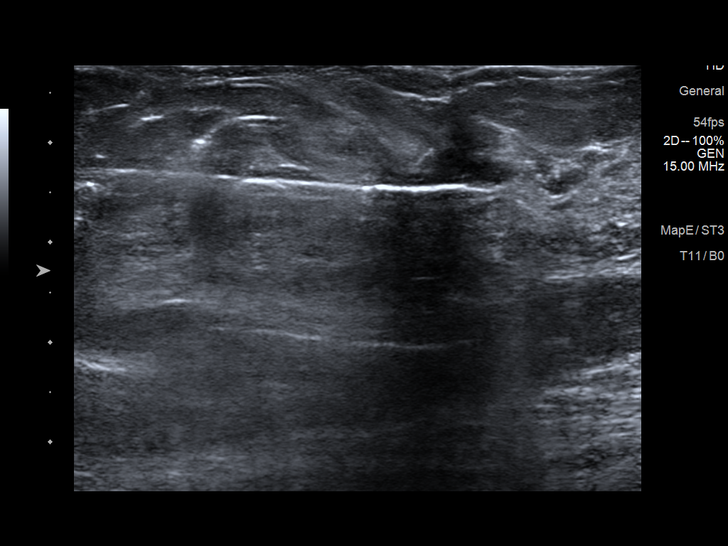
[im 6/19]
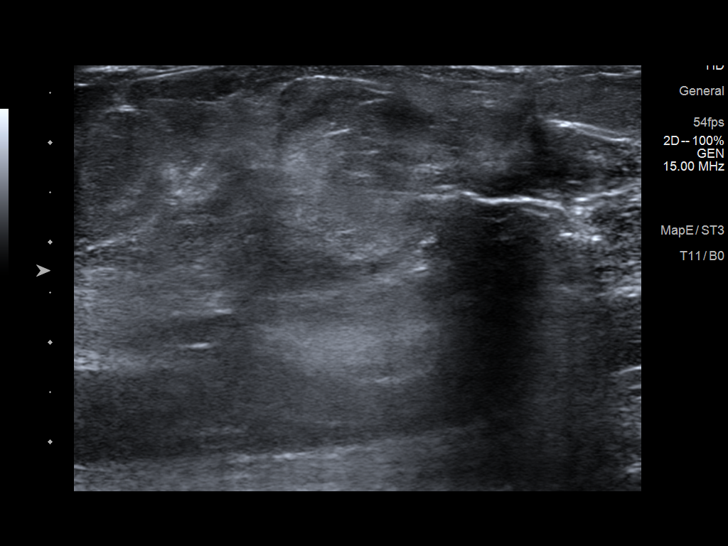
[im 7/19]
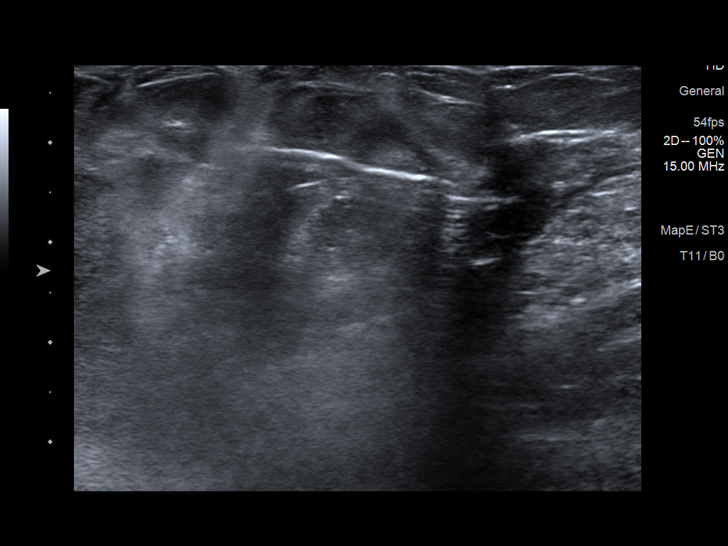
[im 9/19]
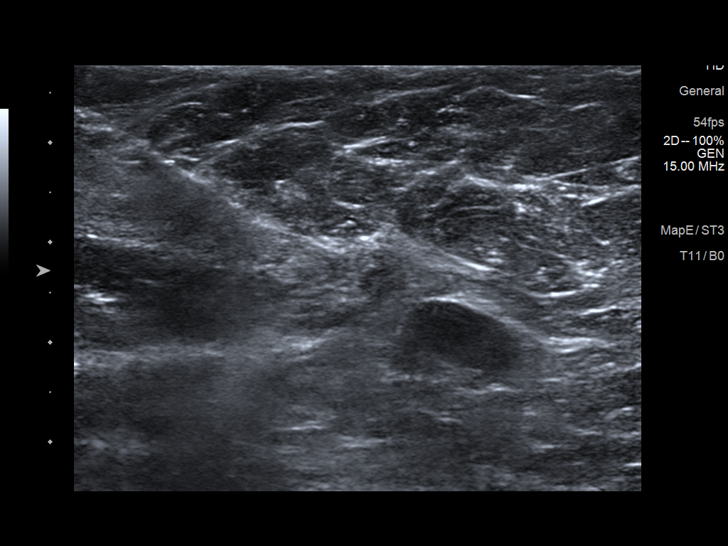
[im 10/19]
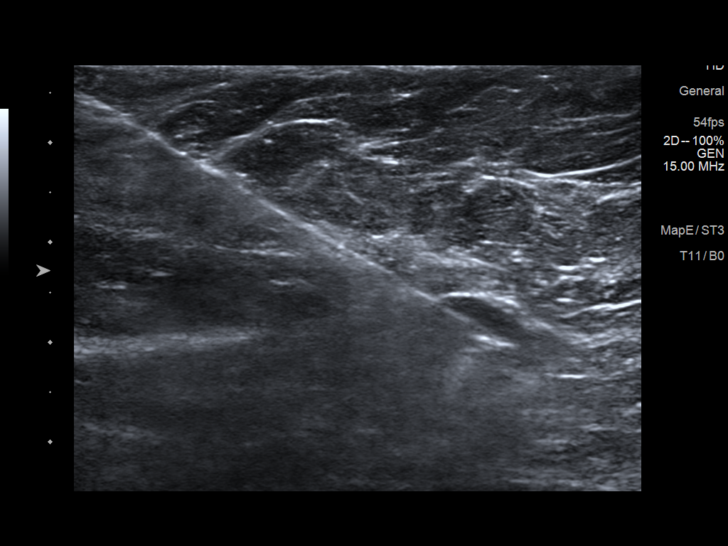
[im 11/19]
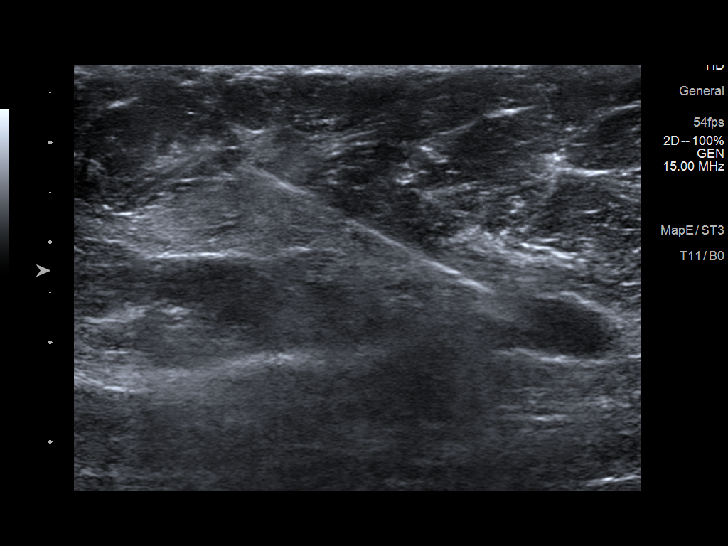
[im 13/19]
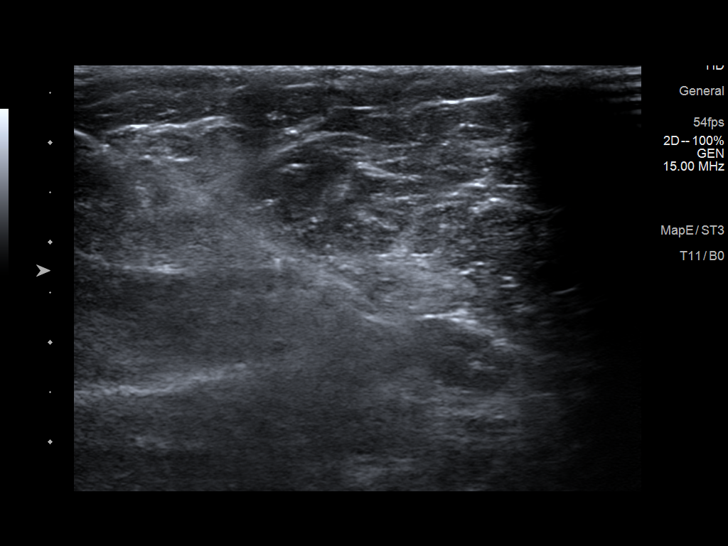
[im 14/19]
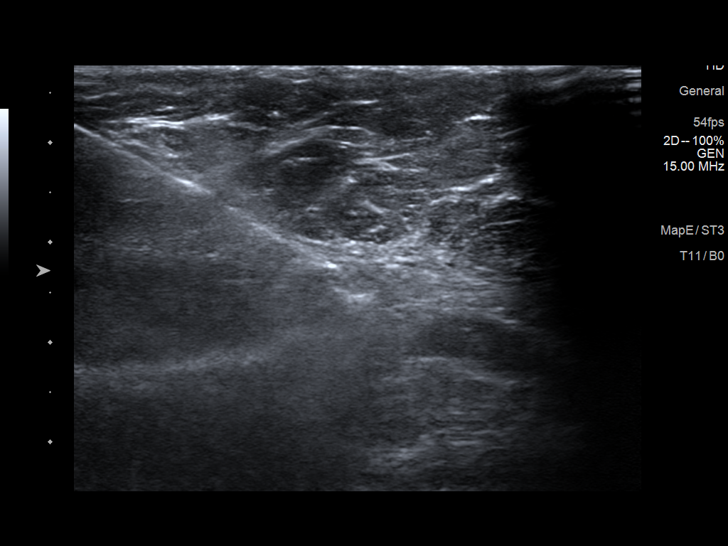
[im 16/19]
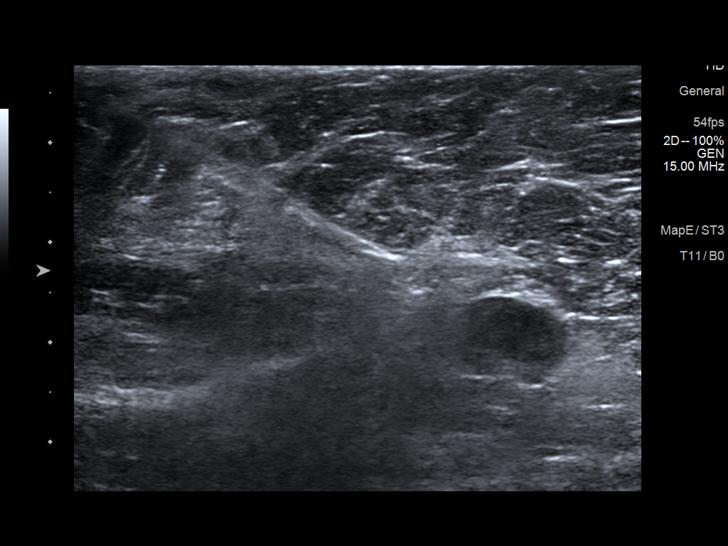
[im 17/19]
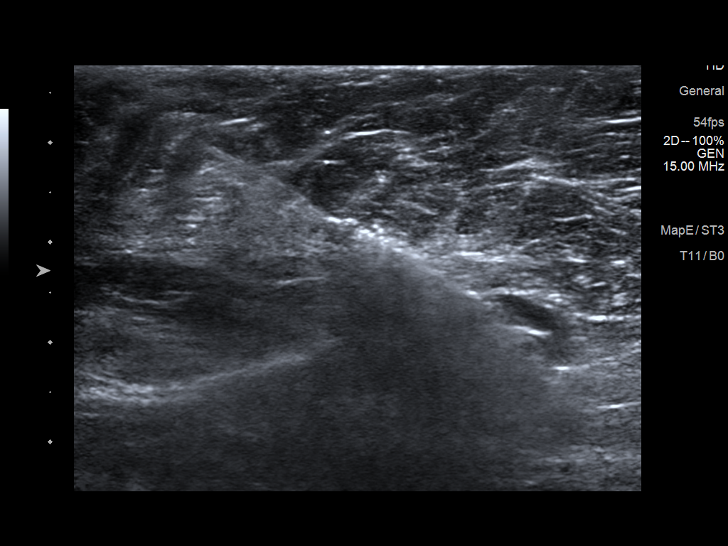
[im 19/19]
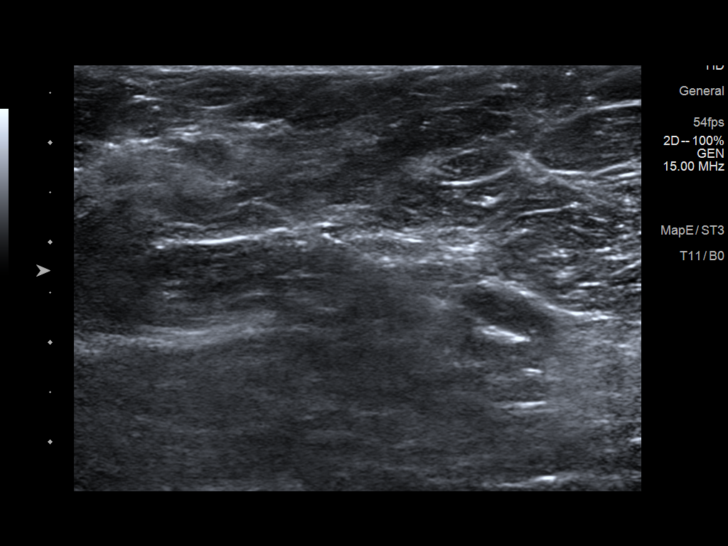

[13 of 19 positions shown; findings below may reference images not displayed]



Lesion quadrant: Upper outer quadrant.

Using sterile technique with chlorhexidine as skin antisepsis, 1%
Lidocaine as local anesthetic, under direct ultrasound
visualization, a 12 gauge [place] Kincius core needle device was used
to perform biopsy of the suspicious mass in the upper outer quadrant
of the right breast using a lateral approach. At the conclusion of
the procedure a ribbon shaped tissue marker clip was deployed into
the biopsy cavity. Follow up 2 view mammogram was performed and
dictated separately.
IMPRESSION: Ultrasound guided biopsy of a suspicious mass in the upper outer
quadrant of the right breast. An approximate 2-3 cm palpable
hematoma was present at the biopsy site post procedure.

## 2018-03-13 ENCOUNTER — Inpatient Hospital Stay: Payer: Medicare Other | Attending: Hematology and Oncology | Admitting: Hematology and Oncology

## 2018-03-13 NOTE — Assessment & Plan Note (Deleted)
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  Letrozole toxicities:  Breast cancer surveillance: 1.  Breast exam April 2019: Benign 2. Mammogram will need to be performed  Return to clinic in 6 months for follow-up and after that we can see her once a year

## 2018-03-22 IMAGING — MG MM CLIP PLACEMENT
2 series · 2 of 2 positions shown · non-contrast
Comparison: Previous exam(s).

CLINICAL DATA: Evaluate biopsy markers

EXAM:
DIAGNOSTIC LEFT MAMMOGRAM POST MRI BIOPSY

[L CC]
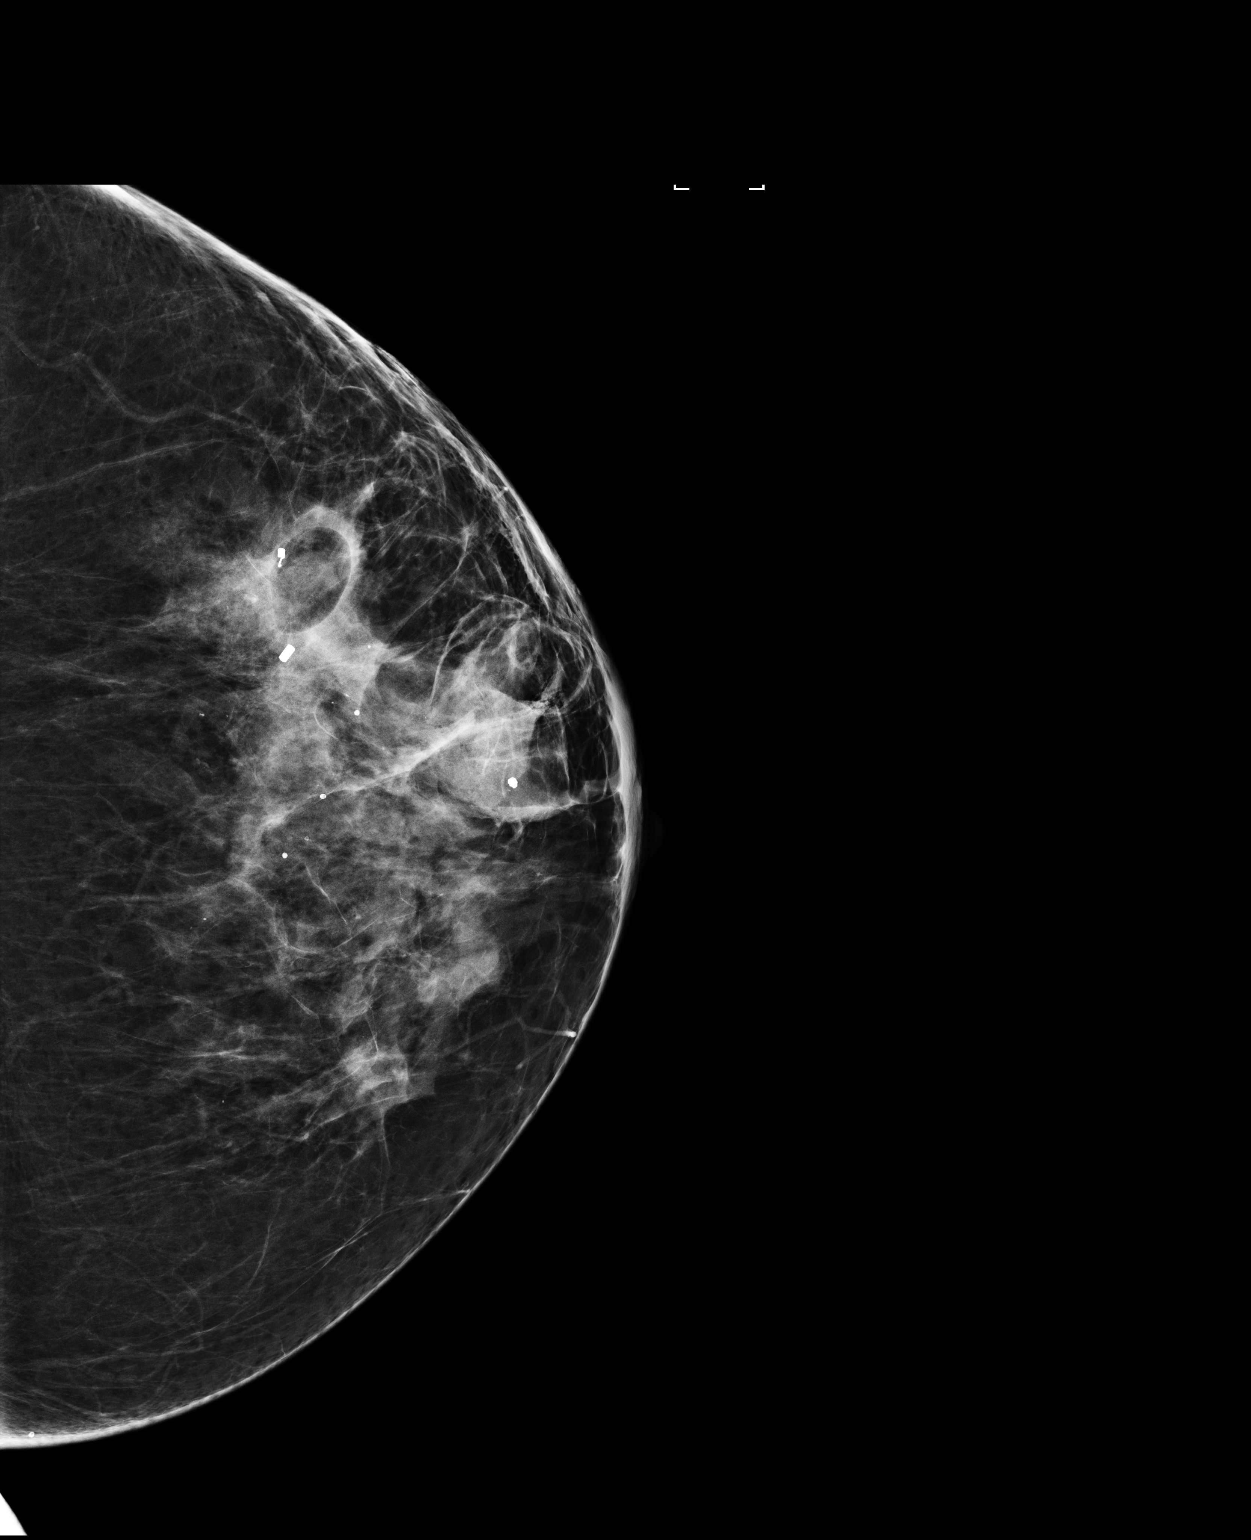

[L ML]
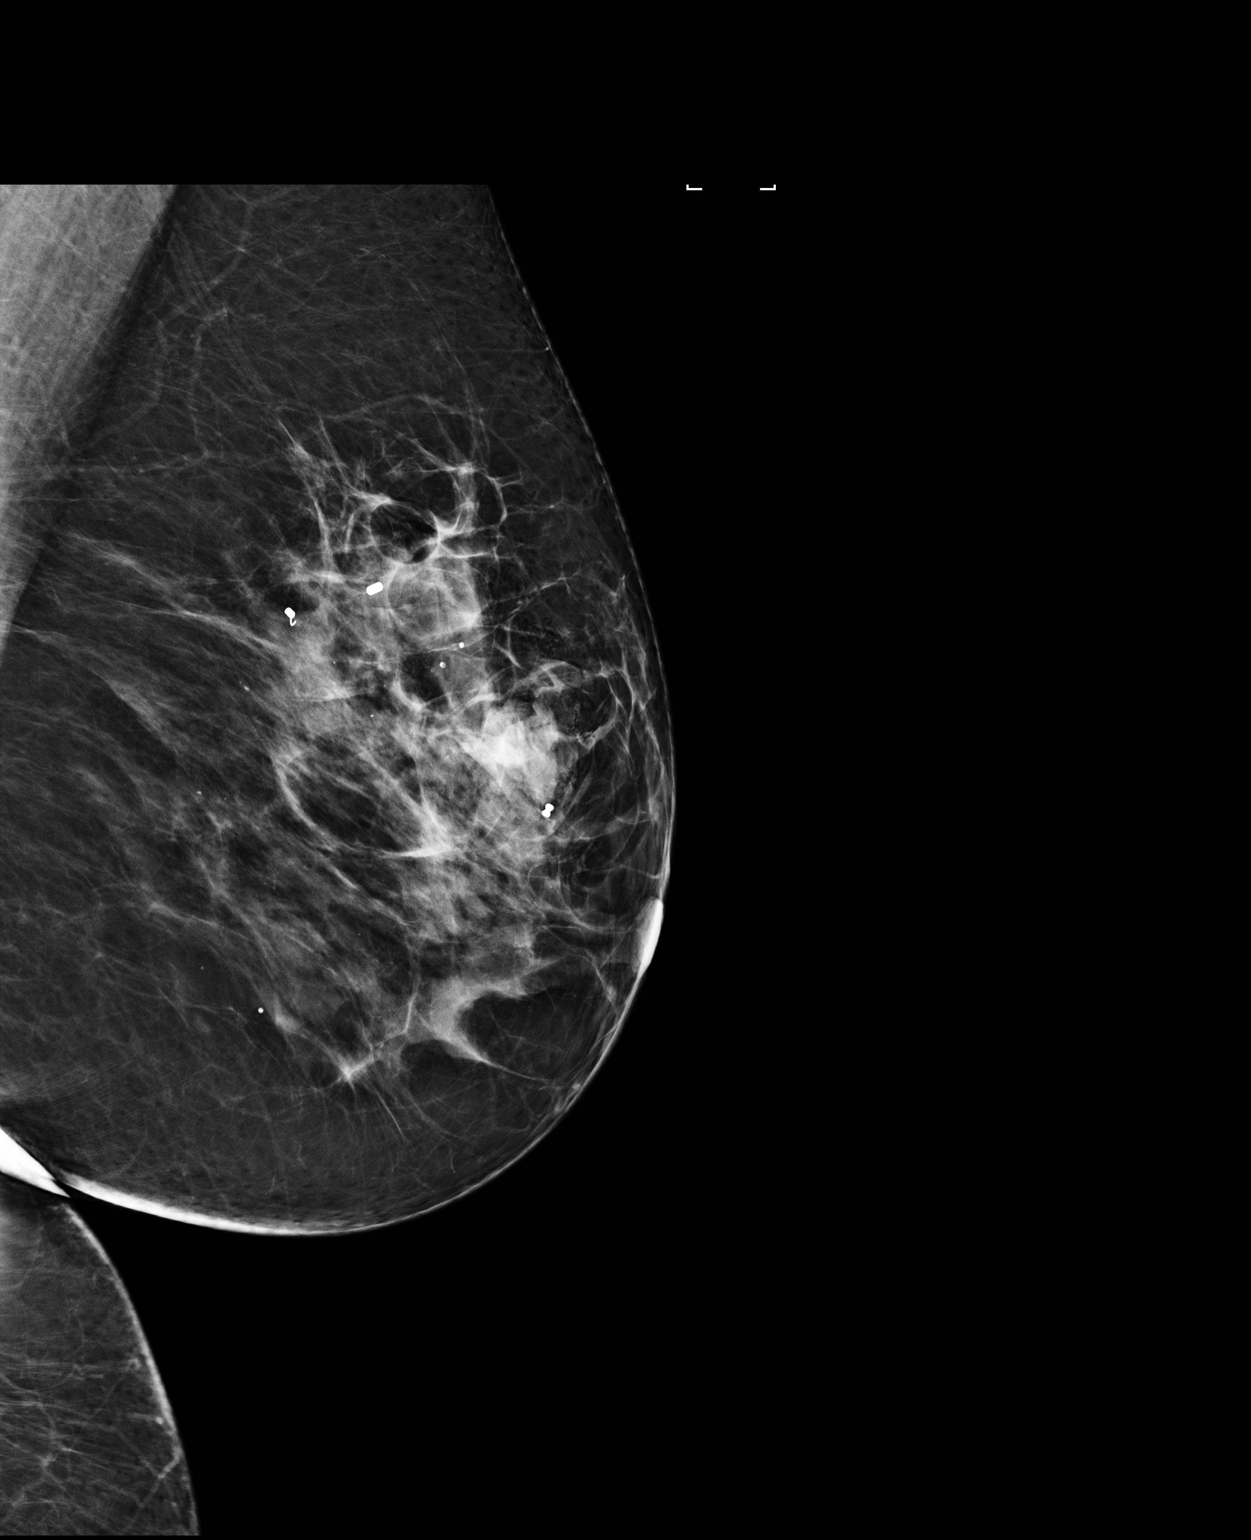

[2 of 2 positions shown; findings below may reference images not displayed]

FINDINGS: Mammographic images were obtained following MRI guided biopsy of non
mass enhanced. The dumbbell shaped marker indicates the site of
anterior non mass enhancement and the cylinder shaped marker
indicates the site of posterior non mass enhancement. Both clips are
in good position.
IMPRESSION: Biopsy marker placement as above.

Final Assessment: Post Procedure Mammograms for Marker Placement

## 2018-05-30 ENCOUNTER — Encounter (HOSPITAL_COMMUNITY): Payer: Self-pay | Admitting: Emergency Medicine

## 2018-05-30 ENCOUNTER — Ambulatory Visit (HOSPITAL_COMMUNITY)
Admission: EM | Admit: 2018-05-30 | Discharge: 2018-05-30 | Disposition: A | Discharge status: Home / Self Care | Payer: Medicare Other | Attending: Family Medicine | Admitting: Family Medicine

## 2018-05-30 ENCOUNTER — Ambulatory Visit (INDEPENDENT_AMBULATORY_CARE_PROVIDER_SITE_OTHER): Payer: Medicare Other

## 2018-05-30 ENCOUNTER — Ambulatory Visit (HOSPITAL_COMMUNITY): Payer: Medicare Other

## 2018-05-30 DIAGNOSIS — W260XXA Contact with knife, initial encounter: Secondary | ICD-10-CM

## 2018-05-30 DIAGNOSIS — S61012A Laceration without foreign body of left thumb without damage to nail, initial encounter: Secondary | ICD-10-CM

## 2018-05-30 MED ORDER — MUPIROCIN 2 % EX OINT: 1.0000 "application " | TOPICAL_OINTMENT | Freq: Two times a day (BID) | CUTANEOUS | 0 refills | Status: DC

## 2018-05-30 MED ORDER — AMOXICILLIN-POT CLAVULANATE 875-125 MG PO TABS: 1.0000 | ORAL_TABLET | Freq: Two times a day (BID) | ORAL | 0 refills | Status: DC

## 2018-05-30 NOTE — ED Triage Notes (Signed)
Pt here for left thumb laceration with knife  Today; bleeding controlled

## 2018-05-30 NOTE — ED Provider Notes (Signed)
[place]    CSN: [id number] Arrival date & time: 05/30/18  1503     History   Chief Complaint Chief Complaint  Patient presents with  . Laceration    HPI Sharon Reid is a 67 y.o. female.   67 year old female comes in for laceration to the left dorsal thumb. States was sharpening knife when laceration occurred. Denies numbness/tingling. Denies trouble moving fingers. Bleeding controlled with pressure. Tetanus up to date.      Past Medical History:  Diagnosis Date  . Breast cancer ([place]) 10/31/2016   patient was notifed today!!  . GERD (gastroesophageal reflux disease)   . History of radiation therapy 03/29/17-05/17/17   left breast 4 field 50.4 Gy in 28 fractions, left axilla 45 Gy in 25 fx, right breast 4 field 50.4 Gy in 28 fractions, right axilla 45 Gy in 25 fx, left breast boost 12 Gy in 6 fx, right breast boost 10 Gy in 5 fractions  . Hypertension   . Refusal of blood transfusions as patient is Jehovah's Witness   . Rib fracture 2016   came from a fall    Patient Active Problem List   Diagnosis Date Noted  . Breast cancer of upper-outer quadrant of left female breast ([place]) 02/27/2017  . Malignant neoplasm of upper-outer quadrant of right breast in female, estrogen receptor positive ([place]) 11/03/2016  . Blunt chest trauma 02/24/2015  . Right rib fracture 02/24/2015  . Contusion, chest wall 02/23/2015    Past Surgical History:  Procedure Laterality Date  . BREAST BIOPSY  10/25/2016  . DILATION AND CURETTAGE OF UTERUS    . RADIOACTIVE SEED GUIDED PARTIAL MASTECTOMY/AXILLARY SENTINEL NODE BIOPSY/AXILLARY NODE DISSECTION Bilateral 01/24/2017   Procedure: RIGHT SEED LOCALIZED LUMPECTOMY AND SEED TARGETED SENTINEL LYMPH NODE BIOPSY, LEFT BREAST SEED BRACKETED LUMPECTOMY AND SENTINEL LYMPH NODE BIOPSY;  Surgeon: Stark Klein, MD;  Location: [facility];  Service: General;  Laterality: Bilateral;  . RE-EXCISION OF BREAST LUMPECTOMY Right 02/14/2017   Procedure: RE-EXCISION RIGHT BREAST LUMPECTOMY;  Surgeon: Stark Klein, MD;  Location: [facility];  Service: General;  Laterality: Right;    OB History   No obstetric history on file.      Home Medications    Prior to Admission medications   Medication Sig Start Date End Date Taking? Authorizing Provider  acetaminophen (TYLENOL) 500 MG tablet Take 250 mg by mouth every 6 (six) hours as needed (for pain/headaches.).     [provider]  amLODipine (NORVASC) 5 MG tablet Take 5 mg by mouth every evening.    [provider]  amoxicillin-clavulanate (AUGMENTIN) 875-125 MG tablet Take 1 tablet by mouth every 12 (twelve) hours. 05/30/18   Tasia Catchings, Antanette Richwine V, PA-C  anastrozole (ARIMIDEX) 1 MG tablet Take 1 tablet (1 mg total) by mouth daily. 05/11/17   Nicholas Lose, MD  mupirocin ointment (BACTROBAN) 2 % Apply 1 application topically 2 (two) times daily. 05/30/18   Ok Edwards, PA-C  non-metallic deodorant Jethro Poling) MISC Apply 1 application topically daily as needed.    [provider]  sodium chloride (OCEAN) 0.65 % SOLN nasal spray Place 1 spray into both nostrils 3 (three) times daily as needed for congestion.    [provider]    Family History Family History  Problem Relation Age of Onset  . Breast cancer Neg Hx   . Colon cancer Neg Hx     Social History Social History   Tobacco Use  . Smoking status: Never Smoker  .  Smokeless tobacco: Never Used  Substance Use Topics  . Alcohol use: No  . Drug use: No     Allergies   Other   Review of Systems Review of Systems  Reason unable to perform ROS: See HPI as above.     Physical Exam Triage Vital Signs ED Triage Vitals  Enc Vitals Group     BP 05/30/18 1528 (!) 186/79     Pulse Rate 05/30/18 1528 88     Resp 05/30/18 1528 18     Temp 05/30/18 1528 98.4 F (36.9 C)     Temp Source 05/30/18 1528 Oral     SpO2 05/30/18 1528 98 %     Weight --      Height --      Head Circumference --      Peak  Flow --      Pain Score 05/30/18 1529 7     Pain Loc --      Pain Edu? --      Excl. in [place]? --    No data found.  Updated Vital Signs BP (!) 186/79 (BP Location: Right Arm)   Pulse 88   Temp 98.4 F (36.9 C) (Oral)   Resp 18   SpO2 98%   Physical Exam Constitutional:      General: She is not in acute distress.    Appearance: She is well-developed. She is not diaphoretic.  HENT:     Head: Normocephalic and atraumatic.  Eyes:     Conjunctiva/sclera: Conjunctivae normal.     Pupils: Pupils are equal, round, and reactive to light.  Musculoskeletal:     Comments: 2.5 cm laceration to the left dorsal thumb, inferior to the PIP joint. Bleeding controlled with pressure. Full ROM of thumb. Sensation intact and equal. Cap refill <2s  Neurological:     Mental Status: She is alert and oriented to person, place, and time.     UC Treatments / Results  Labs (all labs ordered are listed, but only abnormal results are displayed) Labs Reviewed - No data to display  EKG None  Radiology Dg Finger Thumb Left  Result Date: 05/30/2018 CLINICAL DATA:  Laceration of left thumb. EXAM: LEFT THUMB 2+V COMPARISON:  None. FINDINGS: No acute fracture or dislocation. Mild osteoarthritis of the first CMC joint, MCP joint and IP joint. Soft tissue laceration of the left thumb. IMPRESSION: 1.  No acute osseous injury of the left thumb. Electronically Signed   By: Kathreen Devoid   On: 05/30/2018 15:52    Procedures Laceration Repair Date/Time: 05/30/2018 4:41 PM Performed by: Ok Edwards, PA-C Authorized by: Robyn Haber, MD   Consent:    Consent obtained:  Verbal   Consent given by:  Patient   Risks discussed:  Infection, pain, poor cosmetic result, poor wound healing, tendon damage and need for additional repair   Alternatives discussed:  No treatment and referral Anesthesia (see MAR for exact dosages):    Anesthesia method:  Nerve block   Block needle gauge:  27 G   Block anesthetic:   Lidocaine 2% w/o epi   Block injection procedure:  Anatomic landmarks identified, introduced needle, incremental injection, anatomic landmarks palpated and negative aspiration for blood   Block outcome:  Anesthesia achieved Laceration details:    Location:  Finger   Finger location:  L thumb   Length (cm):  2.5   Depth (mm):  5 Repair type:    Repair type:  Simple Pre-procedure details:  Preparation:  Patient was prepped and draped in usual sterile fashion and imaging obtained to evaluate for foreign bodies Exploration:    Hemostasis achieved with:  Direct pressure and tourniquet   Wound exploration: wound explored through full range of motion and entire depth of wound probed and visualized     Wound extent: areolar tissue violated     Wound extent: no nerve damage noted and no tendon damage noted     Contaminated: no   Treatment:    Area cleansed with:  Hibiclens   Amount of cleaning:  Extensive   Irrigation solution:  Sterile saline   Irrigation method:  Tap and pressure wash   Visualized foreign bodies/material removed: no   Skin repair:    Repair method:  Sutures   Suture size:  5-0   Suture material:  Prolene   Suture technique:  Simple interrupted   Number of sutures:  4 Approximation:    Approximation:  Close Post-procedure details:    Dressing:  Antibiotic ointment and bulky dressing   Patient tolerance of procedure:  Tolerated well, no immediate complications   (including critical care time)  Medications Ordered in UC Medications - No data to display  Initial Impression / Assessment and Plan / UC Course  I have reviewed the triage vital signs and the nursing notes.  Pertinent labs & imaging results that were available during my care of the patient were reviewed by me and considered in my medical decision making (see chart for details).    Given depth of the laceration, imaging obtained. No open fracture. Will cover for infection with augmentin. Patient  tolerated procedure well. 4 sutures placed. Wound care instructions given. Return precautions given. Otherwise, follow up in 7 days for suture removal. Patient expresses understanding and agrees to plan.   Final Clinical Impressions(s) / UC Diagnoses   Final diagnoses:  Laceration of left thumb without foreign body without damage to nail, initial encounter    ED Prescriptions    Medication Sig Dispense Auth. Provider   amoxicillin-clavulanate (AUGMENTIN) 875-125 MG tablet Take 1 tablet by mouth every 12 (twelve) hours. 14 tablet Hurbert Duran V, PA-C   mupirocin ointment (BACTROBAN) 2 % Apply 1 application topically 2 (two) times daily. 22 g Tobin Chad, [place] 05/30/18 1643

## 2018-05-30 NOTE — Discharge Instructions (Signed)
4 sutures placed. You can remove current dressing in 24 hours. Keep wound clean and dry. You can clean gently with soap and water. Do not soak area in water. You can apply bactroban ointment to dress the wound. Monitor for spreading redness, increased warmth, increased swelling, fever, follow up for reevaluation needed. Otherwise follow up in 7 days for suture removal.

## 2018-10-09 ENCOUNTER — Other Ambulatory Visit (HOSPITAL_COMMUNITY): Payer: Medicare Other

## 2018-10-09 ENCOUNTER — Ambulatory Visit (HOSPITAL_COMMUNITY): Payer: Medicare Other

## 2018-10-09 ENCOUNTER — Encounter (HOSPITAL_COMMUNITY): Payer: Self-pay | Admitting: Emergency Medicine

## 2018-10-09 ENCOUNTER — Emergency Department (HOSPITAL_COMMUNITY): Payer: Medicare Other

## 2018-10-09 ENCOUNTER — Inpatient Hospital Stay (HOSPITAL_COMMUNITY)
Admission: EM | Admit: 2018-10-09 | Discharge: 2018-10-12 | DRG: 435 | Disposition: A | Discharge status: Hospice | Payer: Medicare Other | Attending: Internal Medicine | Admitting: Internal Medicine

## 2018-10-09 ENCOUNTER — Other Ambulatory Visit: Payer: Self-pay

## 2018-10-09 DIAGNOSIS — N184 Chronic kidney disease, stage 4 (severe): Secondary | ICD-10-CM | POA: Diagnosis present

## 2018-10-09 DIAGNOSIS — N390 Urinary tract infection, site not specified: Secondary | ICD-10-CM | POA: Diagnosis present

## 2018-10-09 DIAGNOSIS — K7201 Acute and subacute hepatic failure with coma: Secondary | ICD-10-CM | POA: Diagnosis not present

## 2018-10-09 DIAGNOSIS — C801 Malignant (primary) neoplasm, unspecified: Secondary | ICD-10-CM | POA: Diagnosis not present

## 2018-10-09 DIAGNOSIS — K7682 Hepatic encephalopathy: Secondary | ICD-10-CM

## 2018-10-09 DIAGNOSIS — Z923 Personal history of irradiation: Secondary | ICD-10-CM

## 2018-10-09 DIAGNOSIS — K7460 Unspecified cirrhosis of liver: Secondary | ICD-10-CM | POA: Diagnosis not present

## 2018-10-09 DIAGNOSIS — C787 Secondary malignant neoplasm of liver and intrahepatic bile duct: Principal | ICD-10-CM | POA: Diagnosis present

## 2018-10-09 DIAGNOSIS — J90 Pleural effusion, not elsewhere classified: Secondary | ICD-10-CM

## 2018-10-09 DIAGNOSIS — N179 Acute kidney failure, unspecified: Secondary | ICD-10-CM | POA: Diagnosis present

## 2018-10-09 DIAGNOSIS — R1011 Right upper quadrant pain: Secondary | ICD-10-CM

## 2018-10-09 DIAGNOSIS — K7200 Acute and subacute hepatic failure without coma: Secondary | ICD-10-CM | POA: Diagnosis present

## 2018-10-09 DIAGNOSIS — Z79811 Long term (current) use of aromatase inhibitors: Secondary | ICD-10-CM

## 2018-10-09 DIAGNOSIS — K219 Gastro-esophageal reflux disease without esophagitis: Secondary | ICD-10-CM | POA: Diagnosis present

## 2018-10-09 DIAGNOSIS — R7989 Other specified abnormal findings of blood chemistry: Secondary | ICD-10-CM

## 2018-10-09 DIAGNOSIS — E875 Hyperkalemia: Secondary | ICD-10-CM | POA: Diagnosis present

## 2018-10-09 DIAGNOSIS — C50412 Malignant neoplasm of upper-outer quadrant of left female breast: Secondary | ICD-10-CM | POA: Diagnosis present

## 2018-10-09 DIAGNOSIS — C50411 Malignant neoplasm of upper-outer quadrant of right female breast: Secondary | ICD-10-CM | POA: Diagnosis not present

## 2018-10-09 DIAGNOSIS — Z79810 Long term (current) use of selective estrogen receptor modulators (SERMs): Secondary | ICD-10-CM | POA: Diagnosis not present

## 2018-10-09 DIAGNOSIS — R160 Hepatomegaly, not elsewhere classified: Secondary | ICD-10-CM

## 2018-10-09 DIAGNOSIS — R17 Unspecified jaundice: Secondary | ICD-10-CM | POA: Diagnosis present

## 2018-10-09 DIAGNOSIS — D696 Thrombocytopenia, unspecified: Secondary | ICD-10-CM | POA: Diagnosis present

## 2018-10-09 DIAGNOSIS — E162 Hypoglycemia, unspecified: Secondary | ICD-10-CM | POA: Diagnosis not present

## 2018-10-09 DIAGNOSIS — E669 Obesity, unspecified: Secondary | ICD-10-CM | POA: Diagnosis present

## 2018-10-09 DIAGNOSIS — R945 Abnormal results of liver function studies: Secondary | ICD-10-CM | POA: Diagnosis not present

## 2018-10-09 DIAGNOSIS — Z853 Personal history of malignant neoplasm of breast: Secondary | ICD-10-CM

## 2018-10-09 DIAGNOSIS — R188 Other ascites: Secondary | ICD-10-CM | POA: Diagnosis present

## 2018-10-09 DIAGNOSIS — K7290 Hepatic failure, unspecified without coma: Secondary | ICD-10-CM | POA: Diagnosis not present

## 2018-10-09 DIAGNOSIS — E860 Dehydration: Secondary | ICD-10-CM | POA: Diagnosis present

## 2018-10-09 DIAGNOSIS — R410 Disorientation, unspecified: Secondary | ICD-10-CM

## 2018-10-09 DIAGNOSIS — E871 Hypo-osmolality and hyponatremia: Secondary | ICD-10-CM | POA: Diagnosis present

## 2018-10-09 DIAGNOSIS — I129 Hypertensive chronic kidney disease with stage 1 through stage 4 chronic kidney disease, or unspecified chronic kidney disease: Secondary | ICD-10-CM | POA: Diagnosis present

## 2018-10-09 DIAGNOSIS — Z1501 Genetic susceptibility to malignant neoplasm of breast: Secondary | ICD-10-CM

## 2018-10-09 DIAGNOSIS — E876 Hypokalemia: Secondary | ICD-10-CM | POA: Diagnosis present

## 2018-10-09 DIAGNOSIS — D72829 Elevated white blood cell count, unspecified: Secondary | ICD-10-CM | POA: Diagnosis present

## 2018-10-09 DIAGNOSIS — Z170 Estrogen receptor positive status [ER+]: Secondary | ICD-10-CM

## 2018-10-09 DIAGNOSIS — Z66 Do not resuscitate: Secondary | ICD-10-CM | POA: Diagnosis present

## 2018-10-09 DIAGNOSIS — K7689 Other specified diseases of liver: Secondary | ICD-10-CM

## 2018-10-09 DIAGNOSIS — G893 Neoplasm related pain (acute) (chronic): Secondary | ICD-10-CM | POA: Diagnosis not present

## 2018-10-09 DIAGNOSIS — D0511 Intraductal carcinoma in situ of right breast: Secondary | ICD-10-CM | POA: Diagnosis present

## 2018-10-09 DIAGNOSIS — Z515 Encounter for palliative care: Secondary | ICD-10-CM | POA: Diagnosis present

## 2018-10-09 LAB — PROTIME-INR
INR: 1.5 — ABNORMAL HIGH (ref 0.8–1.2)
Prothrombin Time: 17.8 seconds — ABNORMAL HIGH (ref 11.4–15.2)

## 2018-10-09 LAB — CBC WITH DIFFERENTIAL/PLATELET
Abs Immature Granulocytes: 0.66 10*3/uL — ABNORMAL HIGH (ref 0.00–0.07)
Basophils Absolute: 0.1 10*3/uL (ref 0.0–0.1)
Basophils Relative: 0 %
Eosinophils Absolute: 0 10*3/uL (ref 0.0–0.5)
Eosinophils Relative: 0 %
HCT: 45.9 % (ref 36.0–46.0)
Hemoglobin: 14.7 g/dL (ref 12.0–15.0)
Immature Granulocytes: 4 %
Lymphocytes Relative: 6 %
Lymphs Abs: 1.1 10*3/uL (ref 0.7–4.0)
MCH: 30 pg (ref 26.0–34.0)
MCHC: 32 g/dL (ref 30.0–36.0)
MCV: 93.7 fL (ref 80.0–100.0)
Monocytes Absolute: 1.8 10*3/uL — ABNORMAL HIGH (ref 0.1–1.0)
Monocytes Relative: 9 %
Neutro Abs: 15.5 10*3/uL — ABNORMAL HIGH (ref 1.7–7.7)
Neutrophils Relative %: 81 %
Platelets: DECREASED 10*3/uL (ref 150–400)
RBC: 4.9 MIL/uL (ref 3.87–5.11)
RDW: 18.9 % — ABNORMAL HIGH (ref 11.5–15.5)
WBC: 19.1 10*3/uL — ABNORMAL HIGH (ref 4.0–10.5)
nRBC: 2.5 % — ABNORMAL HIGH (ref 0.0–0.2)

## 2018-10-09 LAB — ETHANOL: Alcohol, Ethyl (B): <10 mg/dL (ref <10)

## 2018-10-09 LAB — ACETAMINOPHEN LEVEL: Acetaminophen (Tylenol), Serum: <10 ug/mL — ABNORMAL LOW (ref 10–30)

## 2018-10-09 LAB — CBG MONITORING, ED
Glucose-Capillary: 30 mg/dL — CL (ref 70–99)
Glucose-Capillary: 73 mg/dL (ref 70–99)
Glucose-Capillary: 94 mg/dL (ref 70–99)
Glucose-Capillary: 102 mg/dL — ABNORMAL HIGH (ref 70–99)

## 2018-10-09 LAB — COMPREHENSIVE METABOLIC PANEL
ALT: 336 U/L — ABNORMAL HIGH (ref 0–44)
AST: 1475 U/L — ABNORMAL HIGH (ref 15–41)
Albumin: 1.8 g/dL — ABNORMAL LOW (ref 3.5–5.0)
Alkaline Phosphatase: 691 U/L — ABNORMAL HIGH (ref 38–126)
Anion gap: 21 — ABNORMAL HIGH (ref 5–15)
BUN: 44 mg/dL — ABNORMAL HIGH (ref 8–23)
CO2: 19 mmol/L — ABNORMAL LOW (ref 22–32)
Calcium: 9.8 mg/dL (ref 8.9–10.3)
Chloride: 92 mmol/L — ABNORMAL LOW (ref 98–111)
Creatinine, Ser: 1.8 mg/dL — ABNORMAL HIGH (ref 0.44–1.00)
GFR calc Af Amer: 33 mL/min — ABNORMAL LOW (ref >60)
GFR calc non Af Amer: 28 mL/min — ABNORMAL LOW (ref >60)
Glucose, Bld: 118 mg/dL — ABNORMAL HIGH (ref 70–99)
Potassium: 5.2 mmol/L — ABNORMAL HIGH (ref 3.5–5.1)
Sodium: 132 mmol/L — ABNORMAL LOW (ref 135–145)
Total Bilirubin: 18.5 mg/dL (ref 0.3–1.2)
Total Protein: 7.2 g/dL (ref 6.5–8.1)

## 2018-10-09 LAB — GLUCOSE, CAPILLARY: Glucose-Capillary: 97 mg/dL (ref 70–99)

## 2018-10-09 LAB — CK: Total CK: 297 U/L — ABNORMAL HIGH (ref 38–234)

## 2018-10-09 LAB — AMMONIA: Ammonia: 44 umol/L — ABNORMAL HIGH (ref 9–35)

## 2018-10-09 LAB — LIPASE, BLOOD: Lipase: 81 U/L — ABNORMAL HIGH (ref 11–51)

## 2018-10-09 MED ORDER — KCL IN DEXTROSE-NACL 20-5-0.45 MEQ/L-%-% IV SOLN
Freq: Once | INTRAVENOUS | Status: AC
  Administered 2018-10-09: 23:00:00 via INTRAVENOUS
  Filled 2018-10-09: qty 1000

## 2018-10-09 MED ORDER — ONDANSETRON HCL 4 MG PO TABS: 4.0000 mg | ORAL_TABLET | Freq: Four times a day (QID) | ORAL | Status: DC | PRN

## 2018-10-09 MED ORDER — KCL IN DEXTROSE-NACL 20-5-0.45 MEQ/L-%-% IV SOLN
Freq: Once | INTRAVENOUS | Status: AC
  Administered 2018-10-09: 13:00:00 via INTRAVENOUS
  Filled 2018-10-09: qty 1000

## 2018-10-09 MED ORDER — METRONIDAZOLE IN NACL 5-0.79 MG/ML-% IV SOLN
500.0000 mg | Freq: Once | INTRAVENOUS | Status: AC
  Administered 2018-10-09: 20:00:00 500 mg via INTRAVENOUS
  Filled 2018-10-09: qty 100

## 2018-10-09 MED ORDER — SODIUM CHLORIDE 0.9% FLUSH
3.0000 mL | Freq: Two times a day (BID) | INTRAVENOUS | Status: DC
  Administered 2018-10-09 – 2018-10-11 (×5): 3 mL via INTRAVENOUS

## 2018-10-09 MED ORDER — ALBUTEROL SULFATE (2.5 MG/3ML) 0.083% IN NEBU
2.5000 mg | INHALATION_SOLUTION | Freq: Four times a day (QID) | RESPIRATORY_TRACT | Status: DC
  Administered 2018-10-09 – 2018-10-10 (×2): 2.5 mg via RESPIRATORY_TRACT
  Filled 2018-10-09 (×2): qty 3

## 2018-10-09 MED ORDER — ONDANSETRON HCL 4 MG/2ML IJ SOLN: 4.0000 mg | Freq: Four times a day (QID) | INTRAMUSCULAR | Status: DC | PRN

## 2018-10-09 MED ORDER — OXYCODONE HCL 5 MG PO TABS
5.0000 mg | ORAL_TABLET | Freq: Four times a day (QID) | ORAL | Status: DC | PRN
  Administered 2018-10-10 (×2): 5 mg via ORAL
  Filled 2018-10-09 (×2): qty 1

## 2018-10-09 MED ORDER — SODIUM CHLORIDE 0.9 % IV SOLN
2.0000 g | Freq: Once | INTRAVENOUS | Status: AC
  Administered 2018-10-09: 19:00:00 2 g via INTRAVENOUS
  Filled 2018-10-09: qty 20

## 2018-10-09 MED ORDER — DEXTROSE 50 % IV SOLN
1.0000 | Freq: Once | INTRAVENOUS | Status: AC
  Administered 2018-10-09: 12:00:00 50 mL via INTRAVENOUS

## 2018-10-09 MED ORDER — DEXTROSE 50 % IV SOLN
INTRAVENOUS | Status: AC
  Filled 2018-10-09: qty 50

## 2018-10-09 NOTE — ED Notes (Addendum)
Per lab, all blood hemolyzed and needs to be redrawn.

## 2018-10-09 NOTE — ED Notes (Signed)
Patient transported to CT 

## 2018-10-09 NOTE — ED Notes (Signed)
ED TO INPATIENT HANDOFF REPORT  ED Nurse Name and Phone #: [phone]  S Name/Age/Gender Sharon Reid 68 y.o. female Room/Bed: 029C/029C  Code Status   Code Status: Prior  Home/SNF/Other Home Patient oriented to: self, place and situation Is this baseline? No   Triage Complete: Triage complete  Chief Complaint Altered; Jaundice  Triage Note Pt here via [facility], 2-3 weeks of dizziness and increased weakness, reports fall 3 weeks ago and hurt her right side and back, was never evaluated for the injuries.  Slow to respond, oriented with mild confusion about time. Jaundice in sclera of eyes, reports feeling dizzy today. Decreased urine and bowel output x 2 weeks. Pt has cancer but has stopped treatment.   Allergies Allergies  Allergen Reactions  . Other Other (See Comments)    Perfume gives headache    Level of Care/Admitting Diagnosis ED Disposition    ED Disposition Condition Comment   Admit  The patient appears reasonably stabilized for admission considering the current resources, flow, and capabilities available in the ED at this time, and I doubt any other [facility] requiring further screening and/or treatment in the ED prior to admission is  present.       B Medical/Surgery History Past Medical History:  Diagnosis Date  . Breast cancer ([place]) 10/31/2016   patient was notifed today!!  . GERD (gastroesophageal reflux disease)   . History of radiation therapy 03/29/17-05/17/17   left breast 4 field 50.4 Gy in 28 fractions, left axilla 45 Gy in 25 fx, right breast 4 field 50.4 Gy in 28 fractions, right axilla 45 Gy in 25 fx, left breast boost 12 Gy in 6 fx, right breast boost 10 Gy in 5 fractions  . Hypertension   . Refusal of blood transfusions as patient is Jehovah's Witness   . Rib fracture 2016   came from a fall   Past Surgical History:  Procedure Laterality Date  . BREAST BIOPSY  10/25/2016  . DILATION AND CURETTAGE OF UTERUS    . RADIOACTIVE SEED GUIDED PARTIAL  MASTECTOMY/AXILLARY SENTINEL NODE BIOPSY/AXILLARY NODE DISSECTION Bilateral 01/24/2017   Procedure: RIGHT SEED LOCALIZED LUMPECTOMY AND SEED TARGETED SENTINEL LYMPH NODE BIOPSY, LEFT BREAST SEED BRACKETED LUMPECTOMY AND SENTINEL LYMPH NODE BIOPSY;  Surgeon: Stark Klein, MD;  Location: [facility];  Service: General;  Laterality: Bilateral;  . RE-EXCISION OF BREAST LUMPECTOMY Right 02/14/2017   Procedure: RE-EXCISION RIGHT BREAST LUMPECTOMY;  Surgeon: Stark Klein, MD;  Location: [facility];  Service: General;  Laterality: Right;     A IV Location/Drains/Wounds Patient Lines/Drains/Airways Status   Active Line/Drains/Airways    Name:   Placement date:   Placement time:   Site:   Days:   Peripheral IV 10/09/18 Left Antecubital   10/09/18    1208    Antecubital   less than 1   External Urinary Catheter   10/09/18    1240    -   less than 1   Incision (Closed) 01/24/17 Breast Other (Comment)   01/24/17    1423     623   Incision (Closed) 02/14/17 Breast Right   02/14/17    0758     602          Intake/Output Last 24 hours No intake or output data in the 24 hours ending 10/09/18 1642  Labs/Imaging Results for orders placed or performed during the hospital encounter of 10/09/18 (from the past 48 hour(s))  CBG monitoring, ED     Status: Abnormal  Collection Time: 10/09/18 12:03 PM  Result Value Ref Range   Glucose-Capillary 30 (LL) 70 - 99 mg/dL   Comment 1 Notify RN   CBG monitoring, ED     Status: None   Collection Time: 10/09/18 12:16 PM  Result Value Ref Range   Glucose-Capillary 73 70 - 99 mg/dL  Ammonia     Status: Abnormal   Collection Time: 10/09/18  1:39 PM  Result Value Ref Range   Ammonia 44 (H) 9 - 35 umol/L    Comment: Performed at [facility], [place] [address], [place], [place] [address]  Comprehensive metabolic panel     Status: Abnormal   Collection Time: 10/09/18  1:39 PM  Result Value Ref Range   Sodium 132 (L) 135 - 145 mmol/L   Potassium 5.2 (H) 3.5 - 5.1 mmol/L    Chloride 92 (L) 98 - 111 mmol/L   CO2 19 (L) 22 - 32 mmol/L   Glucose, Bld 118 (H) 70 - 99 mg/dL   BUN 44 (H) 8 - 23 mg/dL   Creatinine, Ser 1.80 (H) 0.44 - 1.00 mg/dL   Calcium 9.8 8.9 - 10.3 mg/dL   Total Protein 7.2 6.5 - 8.1 g/dL   Albumin 1.8 (L) 3.5 - 5.0 g/dL   AST 1,475 (H) 15 - 41 U/L   ALT 336 (H) 0 - 44 U/L   Alkaline Phosphatase 691 (H) 38 - 126 U/L   Total Bilirubin 18.5 (HH) 0.3 - 1.2 mg/dL    Comment: ICTERIC SPECIMEN CRITICAL RESULT CALLED TO, READ BACK BY AND VERIFIED WITH: K.CARTER,RN 1435 10/09/2018 CLARK,S    GFR calc non Af Amer 28 (L) >60 mL/min   GFR calc Af Amer 33 (L) >60 mL/min   Anion gap 21 (H) 5 - 15    Comment: Performed at [facility], [place] [address], [place], [place] [address]  Lipase, blood     Status: Abnormal   Collection Time: 10/09/18  1:39 PM  Result Value Ref Range   Lipase 81 (H) 11 - 51 U/L    Comment: Performed at [place] [facility] [address], [place], [place] [address]  CBC with Differential     Status: Abnormal   Collection Time: 10/09/18  1:39 PM  Result Value Ref Range   WBC 19.1 (H) 4.0 - 10.5 K/uL   RBC 4.90 3.87 - 5.11 MIL/uL   Hemoglobin 14.7 12.0 - 15.0 g/dL   HCT 45.9 36.0 - 46.0 %   MCV 93.7 80.0 - 100.0 fL   MCH 30.0 26.0 - 34.0 pg   MCHC 32.0 30.0 - 36.0 g/dL   RDW 18.9 (H) 11.5 - 15.5 %   Platelets  150 - 400 K/uL    PLATELET CLUMPS NOTED ON SMEAR, COUNT APPEARS DECREASED   nRBC 2.5 (H) 0.0 - 0.2 %   Neutrophils Relative % 81 %   Neutro Abs 15.5 (H) 1.7 - 7.7 K/uL   Lymphocytes Relative 6 %   Lymphs Abs 1.1 0.7 - 4.0 K/uL   Monocytes Relative 9 %   Monocytes Absolute 1.8 (H) 0.1 - 1.0 K/uL   Eosinophils Relative 0 %   Eosinophils Absolute 0.0 0.0 - 0.5 K/uL   Basophils Relative 0 %   Basophils Absolute 0.1 0.0 - 0.1 K/uL   WBC Morphology MILD LEFT SHIFT (1-5% METAS, OCC MYELO, OCC BANDS)    Immature Granulocytes 4 %   Abs Immature Granulocytes 0.66 (H) 0.00 - 0.07 K/uL  Comment: Performed at  [facility], [place] [address] [place], [place] [address]  Protime-INR     Status: Abnormal   Collection Time: 10/09/18  1:39 PM  Result Value Ref Range   Prothrombin Time 17.8 (H) 11.4 - 15.2 seconds   INR 1.5 (H) 0.8 - 1.2    Comment: (NOTE) INR goal varies based on device and disease states. Performed at [facility], [place] [address] [place], [place] [address]   CBG monitoring, ED     Status: None   Collection Time: 10/09/18  1:45 PM  Result Value Ref Range   Glucose-Capillary 94 70 - 99 mg/dL  CBG monitoring, ED     Status: Abnormal   Collection Time: 10/09/18  2:46 PM  Result Value Ref Range   Glucose-Capillary 102 (H) 70 - 99 mg/dL   Ct Abdomen Pelvis Wo Contrast  Result Date: 10/09/2018 CLINICAL DATA:  History of breast carcinoma. Dizziness, weakness, jaundice and decreased urine output. EXAM: CT ABDOMEN AND PELVIS WITHOUT CONTRAST TECHNIQUE: Multidetector CT imaging of the abdomen and pelvis was performed following the standard protocol without IV contrast. COMPARISON:  CT of the chest, abdomen and pelvis with contrast on 02/23/2015 FINDINGS: Lower chest: There is atelectasis at the right lung base with a small right pleural effusion. Hepatobiliary: The liver shows significant enlargement and very abnormal nodular parenchymal appearance which is new since 2016. This is likely consistent with involvement by diffuse metastatic disease. There is no evidence of intrahepatic biliary ductal dilatation. Pancreas: Mild edema around the body of the pancreas may relate to the presence of some free fluid around the liver. However, a component of pancreatitis cannot be entirely excluded. Spleen: Normal in size without focal abnormality. Adrenals/Urinary Tract: Adrenal glands are unremarkable. Kidneys are normal, without renal calculi, focal lesion, or hydronephrosis. Bladder is unremarkable. Stomach/Bowel: Bowel shows no evidence of obstruction or ileus. No free air. The appendix is  normal. Vascular/Lymphatic: At least one prominent periportal/peripancreatic lymph node identifiable measuring approximately 1.7 cm in short axis. Multiple small nodules in the region of the gastrohepatic ligament and in the left upper quadrant may represent small gastrohepatic lymph nodes versus metastatic deposits. The largest measures approximately 10 mm. Reproductive: Mild uterine enlargement with probable underlying uterine fibroids. Other: Small amount of ascites is present predominantly adjacent to the liver but also in the pelvis. New subtle nodularity in the peritoneal fat anterior to the lower liver and extending in the right pericolic gutter is suspicious for peritoneal spread of tumor. There are also some small mesenteric nodules suspicious for peritoneal tumor. Musculoskeletal: Subtle small lytic areas in the left iliac bone and new scalloped contour along the posterior margin of the superior endplate of L4 may be consistent with subtle metastatic lesions. The L4 abnormality could also represent a Schmorl's node. IMPRESSION: 1. New significant hepatomegaly with diffusely abnormal nodular appearance of the liver parenchyma likely consistent with involvement by diffuse metastatic disease. No evidence of biliary obstruction. 2. Mild edema around the body of the pancreas may relate to pancreatitis. 3. At least one enlarged periportal/peripancreatic lymph node as well as other findings suspicious for lymph node metastases and peritoneal carcinomatosis. 4. Small amount of ascites, predominantly around the liver. 5. Right basilar atelectasis with small right pleural effusion. Electronically Signed   By: Aletta Edouard M.D.   On: 10/09/2018 15:40   Dg Chest Portable 1 View  Result Date: 10/09/2018 CLINICAL DATA:  68 year old female with altered mental status EXAM: PORTABLE CHEST  1 VIEW COMPARISON:  02/24/2015, 02/23/2015 FINDINGS: Cardiomediastinal silhouette unchanged in size and contour. Similar  appearance of asymmetric elevation the right hemidiaphragm with blunting of the right costophrenic angle. No pneumothorax. Coarsened interstitial markings similar to the prior. Surgical changes of the bilateral chest wall. IMPRESSION: Low lung volumes with right basilar opacity, potentially atelectasis/consolidation or small pleural fluid. Surgical changes of the bilateral chest wall Electronically Signed   By: Corrie Mckusick D.O.   On: 10/09/2018 12:47    Pending Labs Unresulted Labs (From admission, onward)    Start     Ordered   10/10/18 0500  CEA  Tomorrow morning,   R     10/09/18 1636   10/10/18 0500  Cancer antigen 19-9  Tomorrow morning,   R     10/09/18 1636   10/10/18 0500  CA 125  Tomorrow morning,   R     10/09/18 1636   10/09/18 1535  Blood Culture (routine x 2)  BLOOD CULTURE X 2,   STAT     10/09/18 1535   10/09/18 1353  Hepatitis panel, acute  Once,   R     10/09/18 1353   10/09/18 1326  Ethanol  ONCE - STAT,   STAT     10/09/18 1326   10/09/18 1326  Acetaminophen level  ONCE - STAT,   STAT     10/09/18 1326   10/09/18 1219  Urinalysis, Routine w reflex microscopic  ONCE - STAT,   STAT     10/09/18 1220   10/09/18 1218  CBC with Differential  Once,   STAT     10/09/18 1220          Vitals/Pain Today's Vitals   10/09/18 1415 10/09/18 1449 10/09/18 1523 10/09/18 1526  BP: (!) 145/71 (!) 152/79    Pulse: 95 99    Resp: (!) 33 18    Temp:      TempSrc:      SpO2: 92% 95%    PainSc:   0-No pain 0-No pain    Isolation Precautions No active isolations  Medications Medications  cefTRIAXone (ROCEPHIN) 2 g in sodium chloride 0.9 % 100 mL IVPB (has no administration in time range)  metroNIDAZOLE (FLAGYL) IVPB 500 mg (has no administration in time range)  dextrose 50 % solution 50 mL (50 mLs Intravenous Given 10/09/18 1206)  dextrose 5 % and 0.45 % NaCl with KCl 20 mEq/L infusion ( Intravenous New Bag/Given 10/09/18 1239)    Mobility walks with device High fall  risk   Focused Assessments:     R Recommendations: See Admitting Provider Note  Report given to:   Additional Notes: hx of CA and stopped treatment

## 2018-10-09 NOTE — ED Provider Notes (Signed)
Emergency Department Provider Note   I have reviewed the triage vital signs and the nursing notes.   HISTORY  Chief Complaint No chief complaint on file.   HPI Sharon Reid is a 68 y.o. female with PMH of breast cancer in remission on Tamoxifen, HTN, and GERD presents to the emergency department with worsening generalized weakness, right-sided abdominal pain, and jaundice.  Symptoms have been worsening over the past 2 weeks.  She denies any fevers or shaking chills.  She states she began to have lower back pain after a fall.  She was taking over-the-counter pain medications but denies taking any excessive amount of Tylenol.  Denies any history of hepatitis.  She has not been experiencing shortness of breath cough, sore throat.  She tells me that she was hesitant to present to the emergency department with the weakness symptoms but was encouraged today by a roommate to do so.  Patient states that she had not noticed the yellowing of her eyes but was pointed out to her by EMS.  She has noticed decreased urine and stool output but denies any retention symptoms.  Her lower back pain has resolved.  She is not experiencing any numbness or unilateral weakness. Appetite is decreased. Abdominal pain not necessarily worse with eating.    Past Medical History:  Diagnosis Date   Breast cancer ([place]) 10/31/2016   patient was notifed today!!   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[place]) 10/09/2018   Hypoglycemia 10/09/2018   Hepatic cirrhosis ([place]) 10/09/2018   Acute renal failure (ARF) ([place]) 10/09/2018   Dehydration 10/09/2018   Leukocytosis 10/09/2018   Hyperbilirubinemia 10/09/2018   Breast cancer of upper-outer quadrant of left female breast ([place]) 02/27/2017   Malignant neoplasm of upper-outer quadrant of right breast in female, estrogen receptor positive ([place]) 11/03/2016   Blunt chest trauma 02/24/2015   Right rib fracture 02/24/2015   Contusion, chest wall 02/23/2015    Past Surgical History:  Procedure Laterality Date   BREAST BIOPSY  10/25/2016   DILATION AND CURETTAGE OF UTERUS     RADIOACTIVE SEED GUIDED PARTIAL MASTECTOMY/AXILLARY SENTINEL NODE BIOPSY/AXILLARY NODE DISSECTION Bilateral 01/24/2017   Procedure: RIGHT SEED LOCALIZED LUMPECTOMY AND SEED TARGETED SENTINEL LYMPH NODE BIOPSY, LEFT BREAST SEED BRACKETED LUMPECTOMY AND SENTINEL LYMPH NODE BIOPSY;  Surgeon: Stark Klein, MD;  Location: [facility];  Service: General;  Laterality: Bilateral;   RE-EXCISION OF BREAST LUMPECTOMY Right 02/14/2017   Procedure: RE-EXCISION RIGHT BREAST LUMPECTOMY;  Surgeon: Stark Klein, MD;  Location: [facility];  Service: General;  Laterality: Right;    Allergies Other  Family History  Problem Relation Age of Onset   Breast cancer Neg Hx    Colon cancer Neg Hx     Social History Social History   Tobacco Use   Smoking status: Never Smoker   Smokeless tobacco: Never Used  Substance  Use Topics   Alcohol use: Yes    Comment: beer occ   Drug use: No    Review of Systems  Constitutional: No fever/chills. Positive generalized weakness.  Eyes: No visual changes. Positive jaundice.  ENT: No sore throat. Cardiovascular: Denies chest pain. Respiratory: Denies shortness of breath. Gastrointestinal: Positive right sided abdominal pain. No nausea, no vomiting. No diarrhea. No constipation. Genitourinary: Negative for dysuria. Decreased  urine output.  Musculoskeletal: Back pain resolved.  Skin: Negative for rash. Neurological: Negative for headaches, focal weakness or numbness.  10-point ROS otherwise negative.  ____________________________________________   PHYSICAL EXAM:  VITAL SIGNS: ED Triage Vitals  Enc Vitals Group     BP 10/09/18 1201 (!) 117/94     Pulse Rate 10/09/18 1206 94     Resp 10/09/18 1206 15     Temp 10/09/18 1206 98.4 F (36.9 C)     Temp Source 10/09/18 1206 Oral     SpO2 10/09/18 1200 96 %     Pain Score 10/09/18 1205 0   Constitutional: Alert and oriented. Well appearing and in no acute distress. Eyes: Conjunctivae are normal. Icteric sclera.  Head: Atraumatic. Nose: No congestion/rhinnorhea. Mouth/Throat: Mucous membranes are moist. Neck: No stridor.   Cardiovascular: Normal rate, regular rhythm. Good peripheral circulation. Grossly normal heart sounds.   Respiratory: Normal respiratory effort.  No retractions. Lungs CTAB. Gastrointestinal: Soft with mild right upper and lower abdominal tenderness. No rebound or guarding. Negative Murphy's sign. No distention.  Musculoskeletal: No lower extremity tenderness nor edema. No gross deformities of extremities. Neurologic:  Normal speech and language. No gross focal neurologic deficits are appreciated.  Skin:  Skin is warm, dry and intact. Jaundice noted.    ____________________________________________   LABS (all labs ordered are listed, but only abnormal results are displayed)  Labs Reviewed  URINALYSIS, ROUTINE W REFLEX MICROSCOPIC - Abnormal; Notable for the following components:      Result Value   Color, Urine BROWN (*)    APPearance TURBID (*)    Hgb urine dipstick LARGE (*)    Bilirubin Urine MODERATE (*)    Ketones, ur 5 (*)    RBC / HPF >50 (*)    Bacteria, UA MANY (*)    All other components within normal limits  AMMONIA - Abnormal; Notable for the following components:   Ammonia 44 (*)    All other components within  normal limits  COMPREHENSIVE METABOLIC PANEL - Abnormal; Notable for the following components:   Sodium 132 (*)    Potassium 5.2 (*)    Chloride 92 (*)    CO2 19 (*)    Glucose, Bld 118 (*)    BUN 44 (*)    Creatinine, Ser 1.80 (*)    Albumin 1.8 (*)    AST 1,475 (*)    ALT 336 (*)    Alkaline Phosphatase 691 (*)    Total Bilirubin 18.5 (*)    GFR calc non Af Amer 28 (*)    GFR calc Af Amer 33 (*)    Anion gap 21 (*)    All other components within normal limits  LIPASE, BLOOD - Abnormal; Notable for the following components:   Lipase 81 (*)    All other components within normal limits  CBC WITH DIFFERENTIAL/PLATELET - Abnormal; Notable for the following components:   WBC 19.1 (*)    RDW 18.9 (*)    nRBC 2.5 (*)    Neutro Abs 15.5 (*)    Monocytes Absolute 1.8 (*)  Abs Immature Granulocytes 0.66 (*)    All other components within normal limits  PROTIME-INR - Abnormal; Notable for the following components:   Prothrombin Time 17.8 (*)    INR 1.5 (*)    All other components within normal limits  COMPREHENSIVE METABOLIC PANEL - Abnormal; Notable for the following components:   Sodium 132 (*)    Chloride 95 (*)    BUN 46 (*)    Creatinine, Ser 1.85 (*)    Total Protein 6.4 (*)    Albumin 1.5 (*)    AST 1,064 (*)    ALT 276 (*)    Alkaline Phosphatase 618 (*)    Total Bilirubin 17.9 (*)    GFR calc non Af Amer 27 (*)    GFR calc Af Amer 32 (*)    All other components within normal limits  PREALBUMIN - Abnormal; Notable for the following components:   Prealbumin <5.0 (*)    All other components within normal limits  AMMONIA - Abnormal; Notable for the following components:   Ammonia 108 (*)    All other components within normal limits  PROTIME-INR - Abnormal; Notable for the following components:   Prothrombin Time 18.8 (*)    INR 1.6 (*)    All other components within normal limits  CK - Abnormal; Notable for the following components:   Total CK 297 (*)    All  other components within normal limits  ACETAMINOPHEN LEVEL - Abnormal; Notable for the following components:   Acetaminophen (Tylenol), Serum <10 (*)    All other components within normal limits  CBG MONITORING, ED - Abnormal; Notable for the following components:   Glucose-Capillary 30 (*)    All other components within normal limits  CBG MONITORING, ED - Abnormal; Notable for the following components:   Glucose-Capillary 102 (*)    All other components within normal limits  CULTURE, BLOOD (ROUTINE X 2)  CULTURE, BLOOD (ROUTINE X 2)  HEPATITIS PANEL, ACUTE  GLUCOSE, CAPILLARY  ETHANOL  GLUCOSE, CAPILLARY  GLUCOSE, CAPILLARY  CBC WITH DIFFERENTIAL/PLATELET  CEA  CANCER ANTIGEN 19-9  CA 125  CBG MONITORING, ED  CBG MONITORING, ED   ____________________________________________  EKG   EKG Interpretation  Date/Time:  Tuesday October 09 2018 12:04:58 EDT Ventricular Rate:  95 PR Interval:    QRS Duration: 89 QT Interval:  359 QTC Calculation: 452 R Axis:   62 Text Interpretation:  Sinus rhythm No STEMI.  Confirmed by Nanda Quinton [phone]) on 10/09/2018 12:44:37 PM       ____________________________________________  RADIOLOGY  Ct Abdomen Pelvis Wo Contrast  Result Date: 10/09/2018 CLINICAL DATA:  History of breast carcinoma. Dizziness, weakness, jaundice and decreased urine output. EXAM: CT ABDOMEN AND PELVIS WITHOUT CONTRAST TECHNIQUE: Multidetector CT imaging of the abdomen and pelvis was performed following the standard protocol without IV contrast. COMPARISON:  CT of the chest, abdomen and pelvis with contrast on 02/23/2015 FINDINGS: Lower chest: There is atelectasis at the right lung base with a small right pleural effusion. Hepatobiliary: The liver shows significant enlargement and very abnormal nodular parenchymal appearance which is new since 2016. This is likely consistent with involvement by diffuse metastatic disease. There is no evidence of intrahepatic biliary  ductal dilatation. Pancreas: Mild edema around the body of the pancreas may relate to the presence of some free fluid around the liver. However, a component of pancreatitis cannot be entirely excluded. Spleen: Normal in size without focal abnormality. Adrenals/Urinary Tract: Adrenal glands are unremarkable. Kidneys are  normal, without renal calculi, focal lesion, or hydronephrosis. Bladder is unremarkable. Stomach/Bowel: Bowel shows no evidence of obstruction or ileus. No free air. The appendix is normal. Vascular/Lymphatic: At least one prominent periportal/peripancreatic lymph node identifiable measuring approximately 1.7 cm in short axis. Multiple small nodules in the region of the gastrohepatic ligament and in the left upper quadrant may represent small gastrohepatic lymph nodes versus metastatic deposits. The largest measures approximately 10 mm. Reproductive: Mild uterine enlargement with probable underlying uterine fibroids. Other: Small amount of ascites is present predominantly adjacent to the liver but also in the pelvis. New subtle nodularity in the peritoneal fat anterior to the lower liver and extending in the right pericolic gutter is suspicious for peritoneal spread of tumor. There are also some small mesenteric nodules suspicious for peritoneal tumor. Musculoskeletal: Subtle small lytic areas in the left iliac bone and new scalloped contour along the posterior margin of the superior endplate of L4 may be consistent with subtle metastatic lesions. The L4 abnormality could also represent a Schmorl's node. IMPRESSION: 1. New significant hepatomegaly with diffusely abnormal nodular appearance of the liver parenchyma likely consistent with involvement by diffuse metastatic disease. No evidence of biliary obstruction. 2. Mild edema around the body of the pancreas may relate to pancreatitis. 3. At least one enlarged periportal/peripancreatic lymph node as well as other findings suspicious for lymph node  metastases and peritoneal carcinomatosis. 4. Small amount of ascites, predominantly around the liver. 5. Right basilar atelectasis with small right pleural effusion. Electronically Signed   By: Aletta Edouard M.D.   On: 10/09/2018 15:40   Dg Chest Portable 1 View  Result Date: 10/09/2018 CLINICAL DATA:  68 year old female with altered mental status EXAM: PORTABLE CHEST 1 VIEW COMPARISON:  02/24/2015, 02/23/2015 FINDINGS: Cardiomediastinal silhouette unchanged in size and contour. Similar appearance of asymmetric elevation the right hemidiaphragm with blunting of the right costophrenic angle. No pneumothorax. Coarsened interstitial markings similar to the prior. Surgical changes of the bilateral chest wall. IMPRESSION: Low lung volumes with right basilar opacity, potentially atelectasis/consolidation or small pleural fluid. Surgical changes of the bilateral chest wall Electronically Signed   By: Corrie Mckusick D.O.   On: 10/09/2018 12:47   US Abdomen Limited Ruq  Result Date: 10/09/2018 CLINICAL DATA:  Breast carcinoma. Upper abdominal pain with jaundice EXAM: ULTRASOUND ABDOMEN LIMITED RIGHT UPPER QUADRANT COMPARISON:  CT abdomen and pelvis October 09, 2018 FINDINGS: Gallbladder: There is sludge in the gallbladder. No gallstones are evident. There is diffuse gallbladder wall thickening. There is no pericholecystic fluid. No sonographic Murphy sign noted by sonographer. Common bile duct: Diameter: 4 mm. No intrahepatic or extrahepatic biliary duct dilatation. Liver: Multiple lesions are noted throughout the liver, likely widespread metastatic disease. Liver has a nodular contour consistent with a degree of underlying hepatic cirrhosis. Portal vein is patent on color Doppler imaging with normal direction of blood flow towards the liver. There is mild ascites. There is a right pleural effusion. IMPRESSION: 1. Liver has a nodular contour consistent with hepatic cirrhosis. Multiple lesions are noted throughout the  liver, likely metastatic disease. 2. Sludge is noted in the gallbladder. No gallstones evident. There is gallbladder wall thickening. There is ascites which may result in gallbladder wall thickening. A degree of acalculus cholecystitis, however, cannot be excluded in this circumstance. 3.  Right pleural effusion.  Mild ascites noted. Electronically Signed   By: Lowella Grip III M.D.   On: 10/09/2018 17:35    ____________________________________________   PROCEDURES  Procedure(s) performed:  Procedures  None  ____________________________________________   INITIAL IMPRESSION / ASSESSMENT AND PLAN / ED COURSE  Pertinent labs & imaging results that were available during my care of the patient were reviewed by me and considered in my medical decision making (see chart for details).   Patient presents to the emergency department with generalized weakness, confusion, jaundice, right side abdominal pain.  She is in recession from breast cancer.  She has mild tenderness on abdominal exam.  Afebrile with normal vitals here.  Denies significant Tylenol use at home. Doubt infectious etiology without significant infection symptoms at home.   Patient with leukocytosis and significantly elevated Bilirubin and LFTs. No fever or infection symptoms at home. Will cover initially with abx for possible cholangitis as history of no fever may not be totally reliable with some confusion reported at home. AKI also noted. CT scan initially ordered with contrast. Patient meets cutoff for reduced dose but in this clinical setting will avoid additional stress on the kidneys. Can hydrate and repeat CT with contrast as needed. [place] RUQ also ordered.   CT concerning for mets to liver and possible pancreatitis. Patient remains HDS. LFTs elevated with leukocytosis. No sepsis concern. AMS noted. Plan for admit.   Discussed patient's case with Hospitalist, Dr. Tamala Julian to request admission. Patient and family (if present)  updated with plan. Care transferred to Hospitalist service.  I reviewed all nursing notes, vitals, pertinent old records, EKGs, labs, imaging (as available).  GI did not call me back. Discussed with Hospitalist who will follow up.   ____________________________________________  FINAL CLINICAL IMPRESSION(S) / ED DIAGNOSES  Final diagnoses:  RUQ abdominal pain  Disorientation  Elevated LFTs  AKI (acute kidney injury) ([place])  Pleural effusion     MEDICATIONS GIVEN DURING THIS VISIT:  Medications  sodium chloride flush (NS) 0.9 % injection 3 mL (3 mLs Intravenous Given 10/09/18 2244)  ondansetron (ZOFRAN) tablet 4 mg (has no administration in time range)    Or  ondansetron (ZOFRAN) injection 4 mg (has no administration in time range)  oxyCODONE (Oxy IR/ROXICODONE) immediate release tablet 5 mg (has no administration in time range)  albuterol (PROVENTIL) (2.5 MG/3ML) 0.083% nebulizer solution 2.5 mg (has no administration in time range)  dextrose 50 % solution 50 mL (50 mLs Intravenous Given 10/09/18 1206)  dextrose 5 % and 0.45 % NaCl with KCl 20 mEq/L infusion ( Intravenous New Bag/Given 10/09/18 1239)  cefTRIAXone (ROCEPHIN) 2 g in sodium chloride 0.9 % 100 mL IVPB (0 g Intravenous Stopped 10/09/18 1943)  metroNIDAZOLE (FLAGYL) IVPB 500 mg (0 mg Intravenous Stopped 10/09/18 2046)  dextrose 5 % and 0.45 % NaCl with KCl 20 mEq/L infusion ( Intravenous New Bag/Given 10/09/18 2243)    Note:  This document was prepared using Dragon voice recognition software and may include unintentional dictation errors.  Nanda Quinton, MD Emergency Medicine    Torrell Krutz, [facility], MD 10/10/18 [phone]

## 2018-10-09 NOTE — H&P (Signed)
History and Physical    TIKISHA MOLINARO RWE:[id number] DOB: 07-08-1950 DOA: 10/09/2018  Referring MD/NP/PA: Nanda Quinton, MD PCP: Nolene Ebbs, MD  Patient coming from: Via EMS  Chief Complaint: Weakness  I have personally briefly reviewed patient's old medical records in [place]   HPI: Sharon Reid is a 68 y.o. female with medical history significant of bilateral breast cancer s/p lumpectomy and radiation currently on anasrozole , HTN, Jehovah witness, and GERD; who presented for worsening weakness over the last 3 weeks.  Complains of having right-sided abdominal/flank pain.  Symptoms appear to be worsened with any significant movement or pressing on her stomach.  Due to symptoms of weakness she reports being mostly sedimentary during this time.  Other associated symptoms include constipation and poor oral intake.  Denies having any fever, chills, nausea, vomiting, cough, chest pain, shortness of breath, dysuria, lower extremity swelling, or diarrhea.  Patient had reportedly gone to a funeral last month in [place] of her niece who died of COVID-19, reported using facemask along with practice social distancing.  Denied any significant history of alcohol abuse.  Lastly patient was noted also have had a root canal procedure   ED Course: Upon admission into the emergency department patient was noted to be afebrile, pulse 92-100, respirations 15-33,, blood pressure 117/94-152/79, O2 saturation 92 to 99% on 2 L of nasal cannula oxygen.  Labs revealed WBC 19.1, platelets decreased, sodium 132, potassium 5.2, chloride 92, CO2 19, BUN 44, creatinine 1.8, albumin 1.8, lipase 81, AST 1475, ALT 336, total bilirubin 18.5, ammonia 44, and INR 1.5.  Urinalysis has not been obtained as of yet.   Review of Systems  Constitutional: Positive for malaise/fatigue. Negative for fever.  Eyes: Negative for photophobia and pain.  Cardiovascular: Negative for chest pain, palpitations and leg swelling.    Gastrointestinal: Positive for abdominal pain. Negative for nausea and vomiting.  Neurological: Positive for weakness. Negative for focal weakness.    Past Medical History:  Diagnosis Date   Breast cancer ([place]) 10/31/2016   patient was notifed today!!   GERD (gastroesophageal reflux disease)    History of radiation therapy 03/29/17-05/17/17   left breast 4 field 50.4 Gy in 28 fractions, left axilla 45 Gy in 25 fx, right breast 4 field 50.4 Gy in 28 fractions, right axilla 45 Gy in 25 fx, left breast boost 12 Gy in 6 fx, right breast boost 10 Gy in 5 fractions   Hypertension    Refusal of blood transfusions as patient is Jehovah's Witness    Rib fracture 2016   came from a fall    Past Surgical History:  Procedure Laterality Date   BREAST BIOPSY  10/25/2016   DILATION AND CURETTAGE OF UTERUS     RADIOACTIVE SEED GUIDED PARTIAL MASTECTOMY/AXILLARY SENTINEL NODE BIOPSY/AXILLARY NODE DISSECTION Bilateral 01/24/2017   Procedure: RIGHT SEED LOCALIZED LUMPECTOMY AND SEED TARGETED SENTINEL LYMPH NODE BIOPSY, LEFT BREAST SEED BRACKETED LUMPECTOMY AND SENTINEL LYMPH NODE BIOPSY;  Surgeon: Stark Klein, MD;  Location: [facility];  Service: General;  Laterality: Bilateral;   RE-EXCISION OF BREAST LUMPECTOMY Right 02/14/2017   Procedure: RE-EXCISION RIGHT BREAST LUMPECTOMY;  Surgeon: Stark Klein, MD;  Location: [facility];  Service: General;  Laterality: Right;     reports that she has never smoked. She has never used smokeless tobacco. She reports current alcohol use. She reports that she does not use drugs.  Allergies  Allergen Reactions   Other Other (See Comments)    Perfume gives headache  Family History  Problem Relation Age of Onset   Breast cancer Neg Hx    Colon cancer Neg Hx     Prior to Admission medications   Medication Sig Start Date End Date Taking? Authorizing Provider  acetaminophen (TYLENOL) 500 MG tablet Take 250 mg by mouth every 6 (six) hours as needed (for  pain/headaches.).     [provider]  amLODipine (NORVASC) 5 MG tablet Take 5 mg by mouth every evening.    [provider]  amoxicillin-clavulanate (AUGMENTIN) 875-125 MG tablet Take 1 tablet by mouth every 12 (twelve) hours. 05/30/18   Tasia Catchings, Amy V, PA-C  anastrozole (ARIMIDEX) 1 MG tablet Take 1 tablet (1 mg total) by mouth daily. 05/11/17   Nicholas Lose, MD  mupirocin ointment (BACTROBAN) 2 % Apply 1 application topically 2 (two) times daily. 05/30/18   Ok Edwards, PA-C  non-metallic deodorant Jethro Poling) MISC Apply 1 application topically daily as needed.    [provider]  sodium chloride (OCEAN) 0.65 % SOLN nasal spray Place 1 spray into both nostrils 3 (three) times daily as needed for congestion.    [provider]    Physical Exam:  Constitutional: Obese elderly female who appears no acute distress Vitals:   10/09/18 1415 10/09/18 1449 10/09/18 1600 10/09/18 1615  BP: (!) 145/71 (!) 152/79 129/71 137/78  Pulse: 95 99 90 93  Resp: (!) 33 18 (!) 27 (!) 21  Temp:      TempSrc:      SpO2: 92% 95% 97% 96%   Eyes: PERRL, sclera icterus present ENMT: Mucous membranes are dry. Posterior pharynx clear of any exudate or lesions. Neck: normal, supple, no masses, no thyromegaly.  No JVD. Respiratory: clear to auscultation bilaterally, no wheezing, no crackles. Normal respiratory effort. No accessory muscle use.  Currently on 2 L of nasal cannula oxygen. Cardiovascular: Regular rate and rhythm, no murmurs / rubs / gallops. No extremity edema. 2+ pedal pulses. No carotid bruits.  Abdomen: Tenderness palpation of the right upper quadrant noted.  No masses palpated.  Bowel sounds positive.  Musculoskeletal: no clubbing / cyanosis. No joint deformity upper and lower extremities. Good ROM, no contractures. Normal muscle tone.  Skin: no rashes, lesions, ulcers. No induration Neurologic: CN 2-12 grossly intact. Sensation intact, DTR normal. Strength 5/5 in all 4.    Psychiatric: Normal judgment and insight. Alert and oriented x 3. Normal mood.     Labs on Admission: I have personally reviewed following labs and imaging studies  CBC: Recent Labs  Lab 10/09/18 1339  WBC 19.1*  NEUTROABS 15.5*  HGB 14.7  HCT 45.9  MCV 93.7  PLT PLATELET CLUMPS NOTED ON SMEAR, COUNT APPEARS DECREASED   Basic Metabolic Panel: Recent Labs  Lab 10/09/18 1339  NA 132*  K 5.2*  CL 92*  CO2 19*  GLUCOSE 118*  BUN 44*  CREATININE 1.80*  CALCIUM 9.8   GFR: CrCl cannot be calculated (Unknown ideal weight.). Liver Function Tests: Recent Labs  Lab 10/09/18 1339  AST 1,475*  ALT 336*  ALKPHOS 691*  BILITOT 18.5*  PROT 7.2  ALBUMIN 1.8*   Recent Labs  Lab 10/09/18 1339  LIPASE 81*   Recent Labs  Lab 10/09/18 1339  AMMONIA 44*   Coagulation Profile: Recent Labs  Lab 10/09/18 1339  INR 1.5*   Cardiac Enzymes: No results for input(s): CKTOTAL, CKMB, CKMBINDEX, TROPONINI in the last 168 hours. BNP (last 3 results) No results for input(s): PROBNP in the last 8760  hours. HbA1C: No results for input(s): HGBA1C in the last 72 hours. CBG: Recent Labs  Lab 10/09/18 1203 10/09/18 1216 10/09/18 1345 10/09/18 1446  GLUCAP 30* 73 94 102*   Lipid Profile: No results for input(s): CHOL, HDL, LDLCALC, TRIG, CHOLHDL, LDLDIRECT in the last 72 hours. Thyroid Function Tests: No results for input(s): TSH, T4TOTAL, FREET4, T3FREE, THYROIDAB in the last 72 hours. Anemia Panel: No results for input(s): VITAMINB12, FOLATE, FERRITIN, TIBC, IRON, RETICCTPCT in the last 72 hours. Urine analysis: No results found for: COLORURINE, APPEARANCEUR, LABSPEC, PHURINE, GLUCOSEU, HGBUR, BILIRUBINUR, KETONESUR, PROTEINUR, UROBILINOGEN, NITRITE, LEUKOCYTESUR Sepsis Labs: No results found for this or any previous visit (from the past 240 hour(s)).   Radiological Exams on Admission: Ct Abdomen Pelvis Wo Contrast  Result Date: 10/09/2018 CLINICAL DATA:  History of  breast carcinoma. Dizziness, weakness, jaundice and decreased urine output. EXAM: CT ABDOMEN AND PELVIS WITHOUT CONTRAST TECHNIQUE: Multidetector CT imaging of the abdomen and pelvis was performed following the standard protocol without IV contrast. COMPARISON:  CT of the chest, abdomen and pelvis with contrast on 02/23/2015 FINDINGS: Lower chest: There is atelectasis at the right lung base with a small right pleural effusion. Hepatobiliary: The liver shows significant enlargement and very abnormal nodular parenchymal appearance which is new since 2016. This is likely consistent with involvement by diffuse metastatic disease. There is no evidence of intrahepatic biliary ductal dilatation. Pancreas: Mild edema around the body of the pancreas may relate to the presence of some free fluid around the liver. However, a component of pancreatitis cannot be entirely excluded. Spleen: Normal in size without focal abnormality. Adrenals/Urinary Tract: Adrenal glands are unremarkable. Kidneys are normal, without renal calculi, focal lesion, or hydronephrosis. Bladder is unremarkable. Stomach/Bowel: Bowel shows no evidence of obstruction or ileus. No free air. The appendix is normal. Vascular/Lymphatic: At least one prominent periportal/peripancreatic lymph node identifiable measuring approximately 1.7 cm in short axis. Multiple small nodules in the region of the gastrohepatic ligament and in the left upper quadrant may represent small gastrohepatic lymph nodes versus metastatic deposits. The largest measures approximately 10 mm. Reproductive: Mild uterine enlargement with probable underlying uterine fibroids. Other: Small amount of ascites is present predominantly adjacent to the liver but also in the pelvis. New subtle nodularity in the peritoneal fat anterior to the lower liver and extending in the right pericolic gutter is suspicious for peritoneal spread of tumor. There are also some small mesenteric nodules suspicious for  peritoneal tumor. Musculoskeletal: Subtle small lytic areas in the left iliac bone and new scalloped contour along the posterior margin of the superior endplate of L4 may be consistent with subtle metastatic lesions. The L4 abnormality could also represent a Schmorl's node. IMPRESSION: 1. New significant hepatomegaly with diffusely abnormal nodular appearance of the liver parenchyma likely consistent with involvement by diffuse metastatic disease. No evidence of biliary obstruction. 2. Mild edema around the body of the pancreas may relate to pancreatitis. 3. At least one enlarged periportal/peripancreatic lymph node as well as other findings suspicious for lymph node metastases and peritoneal carcinomatosis. 4. Small amount of ascites, predominantly around the liver. 5. Right basilar atelectasis with small right pleural effusion. Electronically Signed   By: Aletta Edouard M.D.   On: 10/09/2018 15:40   Dg Chest Portable 1 View  Result Date: 10/09/2018 CLINICAL DATA:  68 year old female with altered mental status EXAM: PORTABLE CHEST 1 VIEW COMPARISON:  02/24/2015, 02/23/2015 FINDINGS: Cardiomediastinal silhouette unchanged in size and contour. Similar appearance of asymmetric elevation the right hemidiaphragm  with blunting of the right costophrenic angle. No pneumothorax. Coarsened interstitial markings similar to the prior. Surgical changes of the bilateral chest wall. IMPRESSION: Low lung volumes with right basilar opacity, potentially atelectasis/consolidation or small pleural fluid. Surgical changes of the bilateral chest wall Electronically Signed   By: Corrie Mckusick D.O.   On: 10/09/2018 12:47   US Abdomen Limited Ruq  Result Date: 10/09/2018 CLINICAL DATA:  Breast carcinoma. Upper abdominal pain with jaundice EXAM: ULTRASOUND ABDOMEN LIMITED RIGHT UPPER QUADRANT COMPARISON:  CT abdomen and pelvis October 09, 2018 FINDINGS: Gallbladder: There is sludge in the gallbladder. No gallstones are evident. There  is diffuse gallbladder wall thickening. There is no pericholecystic fluid. No sonographic Murphy sign noted by sonographer. Common bile duct: Diameter: 4 mm. No intrahepatic or extrahepatic biliary duct dilatation. Liver: Multiple lesions are noted throughout the liver, likely widespread metastatic disease. Liver has a nodular contour consistent with a degree of underlying hepatic cirrhosis. Portal vein is patent on color Doppler imaging with normal direction of blood flow towards the liver. There is mild ascites. There is a right pleural effusion. IMPRESSION: 1. Liver has a nodular contour consistent with hepatic cirrhosis. Multiple lesions are noted throughout the liver, likely metastatic disease. 2. Sludge is noted in the gallbladder. No gallstones evident. There is gallbladder wall thickening. There is ascites which may result in gallbladder wall thickening. A degree of acalculus cholecystitis, however, cannot be excluded in this circumstance. 3.  Right pleural effusion.  Mild ascites noted. Electronically Signed   By: Lowella Grip III M.D.   On: 10/09/2018 17:35    EKG: Independently reviewed.  Sinus rhythm at 95 bpm  Assessment/Plan Hyperbilirubinemia, elevated liver enzymes, suspected metastatic liver nodules: Acute.  Patient presented with complaints of right flank pain.  Labs revealed AST 1475, ALT 336, and total bilirubin 18.5. Imaging studies appear to show nodular contour of the liver consistent with hepatic cirrhosis, multiple lesions concerning for metastatic disease, and gallbladder wall thickening, and small amount ascites around liver.  Gastroenterology consulted and have consulted interventional radiology for possible biopsy.  Affecting liver enzymes elevated due to malignancy and dehydration. -Admit to a telemetry bed -N.p.o. after midnight for possible biopsy -IV fluids as tolerated -Follow-up pending hepatitis panel, Tylenol level, and ethanol level -Check CMP, ammonia, CA 125,  cancer antigen 19-9, CEA, in a.m.  -Appreciate GI consultative services, will follow for further recommendation -Follow-up interventional radiology consult in a.m.  Suspected pancreatitis: Acute.  CT imaging did note inflammation around the head of the pancreas and lipase elevated at 81. -Continue IV fluids D5 -0.45%NS at 100 ml/hr -Pain management  Hepatic cirrhosis with ascites: As seen on imaging.  Cause of cirrhosis unknown.  Hypoglycemia: Patient not on any oral hypoglycemics but glucose noted to be as low as 30 on admission.  -Hypoglycemic protocols -Check glucose every 4 hours -Adjust IV fluids as needed  Leukocytosis: WBC elevated at 19.1.  Currently afebrile.  Could be reactive in nature to above.  Patient was given 1 dose of Rocephin and metronidazole. -Recheck CBC in a.m. -Reassess and determine if antibiotics need to be continued.  Acute renal failure secondary to dehydration: Patient presents with creatinine elevated up to 1.8 with BUN 44.  At baseline creatinine previously have been within normal limits.  Elevated BUN to creatinine ratio suggest prerenal cause.  No clear signs of obstruction noted. -Follow-up urinalysis -Strict intake and output -IV fluids -Recheck function in a.m.  Hyperkalemia: Acute.  On admission potassium mildly elevated  at 5.2.  No significant EKG changes appreciated.  Patient on IV fluids. -Recheck potassium in a.m.  Generalized weakness, suspected severe protein calorie malnutrition: Albumin on admission noted to be as low as 1.8. -Check prealbumin in a.m. -Consider need of physical therapy to evaluate and treat  History of breast cancer: Patient reportedly had bilateral lumpectomies in 01/2017 and underwent radiation therapy, but declined chemotherapy for her HER-2 positive breast cancer.  Currently being treated with anastrozole.  Followed by Dr.Gudena of oncology. -May want to notify her oncologist in a.m.  DVT prophylaxis: SCDs Code  Status: Full Family Communication: No family present at bedside Disposition Plan: To be determined Consults called: Gastroenterology Admission status: Inpatient  Norval Morton MD [facility] Pager [phone]   If 7PM-7AM, please contact night-coverage [email or web address] Password [facility]  10/09/2018, 5:54 PM

## 2018-10-09 NOTE — ED Notes (Signed)
CBG 30, amp of D50 given. MD Long at bed side.

## 2018-10-09 NOTE — ED Notes (Signed)
Patient transported to US 

## 2018-10-09 NOTE — ED Notes (Signed)
Pt returned from US

## 2018-10-09 NOTE — Consult Note (Addendum)
[facility] Consult: 3:52 PM 10/09/2018  LOS: 0 days    Referring Provider: Dr Laverta Baltimore  Primary Care Physician:  Nolene Ebbs, MD Primary Gastroenterologist: unassigned   Reason for Consultation: Jaundice.  Liver nodule suspicious for mets.   HPI: Sharon Reid is a 68 y.o. female. Jehovahs Witness.  PMH stage Ia bilateral breast cancer, s/P bilateral lumpectomies.  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  She was compliant with taking letrozole. Patient has never had upper endoscopy or colonoscopy.   At the beginning of March she went up to [facility] to attend the funeral of her niece who apparently died of COVID.  She says she practiced social distancing was wearing a mask etc.  She was helping move furniture and injured her back when she fell.  When she returned to [facility] she underwent planned root canal and was treated with an antibiotic and took ibuprofen for a few days.  Right sided thoracic back pain continued.  The pain eventually migrated into her right flank, slightly into the right upper quadrant.  Pain worse when she would move around.  Coinciding with this was anorexia and profound fatigue with some dizziness.  For the last few weeks she is pretty much spent most of her time laying in bed.  She thought she had blood in her urine because the urine got dark within the past week or so.  There is been no nausea, vomiting, fever, chills.  No sore throat, no shortness of breath.   Today her roommate, who had not seen her in daylight in several days, noticed that she had scleral icterus. Output somewhat decreased.  T bili 18.5.  Alk phos 691.  AST/ALT 1475/336.  Lipase 81. INR 1.5.   K 5.2.  Na 132.  + AKI 44/1.8.    WBC 19.   CT shows hepatomegaly,  nodular liver c/w diffuse mets.  No biliary obstruction.  ? Pancreatitis: mild edema around body of pancreas.  Peripancreatic lymph adenopathy, susp for mets and peritoneal carcinomatosis.  Small peri-hepatic ascites.  Right pleural effusion.    Patient does not use Tylenol as it causes diarrhea but I note that a Tylenol level has been ordered.  She does not drink alcohol, and alcohol level is pending along with acute hepatitis panel. Family history of liver disease or cancers.  Past Medical History:  Diagnosis Date   Breast cancer ([place]) 10/31/2016   patient was notifed today!!   GERD (gastroesophageal reflux disease)    History of radiation therapy 03/29/17-05/17/17   left breast 4 field 50.4 Gy in 28 fractions, left axilla 45 Gy in 25 fx, right breast 4 field 50.4 Gy in 28 fractions, right axilla 45 Gy in 25 fx, left breast boost 12 Gy in 6 fx, right breast boost 10 Gy in 5 fractions   Hypertension    Refusal of blood transfusions as patient is Jehovah's Witness    Rib fracture 2016   came from a fall    Past Surgical History:  Procedure Laterality Date  BREAST BIOPSY  10/25/2016   DILATION AND CURETTAGE OF UTERUS     RADIOACTIVE SEED GUIDED PARTIAL MASTECTOMY/AXILLARY SENTINEL NODE BIOPSY/AXILLARY NODE DISSECTION Bilateral 01/24/2017   Procedure: RIGHT SEED LOCALIZED LUMPECTOMY AND SEED TARGETED SENTINEL LYMPH NODE BIOPSY, LEFT BREAST SEED BRACKETED LUMPECTOMY AND SENTINEL LYMPH NODE BIOPSY;  Surgeon: Stark Klein, MD;  Location: [facility];  Service: General;  Laterality: Bilateral;   RE-EXCISION OF BREAST LUMPECTOMY Right 02/14/2017   Procedure: RE-EXCISION RIGHT BREAST LUMPECTOMY;  Surgeon: Stark Klein, MD;  Location: [facility];  Service: General;  Laterality: Right;    Prior to Admission medications   Medication Sig Start Date End Date Taking? Authorizing Provider  acetaminophen (TYLENOL) 500 MG tablet Take 250 mg by mouth every 6 (six) hours as needed (for pain/headaches.).      [provider]  amLODipine (NORVASC) 5 MG tablet Take 5 mg by mouth every evening.    [provider]  amoxicillin-clavulanate (AUGMENTIN) 875-125 MG tablet Take 1 tablet by mouth every 12 (twelve) hours. 05/30/18   Tasia Catchings, Amy V, PA-C  anastrozole (ARIMIDEX) 1 MG tablet Take 1 tablet (1 mg total) by mouth daily. 05/11/17   Nicholas Lose, MD  mupirocin ointment (BACTROBAN) 2 % Apply 1 application topically 2 (two) times daily. 05/30/18   Ok Edwards, PA-C  non-metallic deodorant Jethro Poling) MISC Apply 1 application topically daily as needed.    [provider]  sodium chloride (OCEAN) 0.65 % SOLN nasal spray Place 1 spray into both nostrils 3 (three) times daily as needed for congestion.    [provider]    Scheduled Meds:  Infusions:  cefTRIAXone (ROCEPHIN)  IV     metronidazole     PRN Meds:    Allergies as of 10/09/2018 - Review Complete 10/09/2018  Allergen Reaction Noted   Other Other (See Comments) 10/17/2016    Family History  Problem Relation Age of Onset   Breast cancer Neg Hx    Colon cancer Neg Hx     Social History   Socioeconomic History   Marital status: Divorced    Spouse name: Not on file   Number of children: Not on file   Years of education: Not on file   Highest education level: Not on file  Occupational History   Occupation: retired  Scientist, product/process development strain: Not on file   Food insecurity:    Worry: Not on file    Inability: Not on Lexicographer needs:    Medical: Not on file    Non-medical: Not on file  Tobacco Use   Smoking status: Never Smoker   Smokeless tobacco: Never Used  Substance and Sexual Activity   Alcohol use: Yes    Comment: beer occ   Drug use: No   Sexual activity: Not on file  Lifestyle   Physical activity:    Days per week: Not on file    Minutes per session: Not on file   Stress: Not on file  Relationships   Social connections:    Talks on  phone: Not on file    Gets together: Not on file    Attends religious service: Not on file    Active member of club or organization: Not on file    Attends meetings of clubs or organizations: Not on file    Relationship status: Not on file   Intimate partner violence:    Fear of current or ex partner: Not on file    Emotionally  abused: Not on file    Physically abused: Not on file    Forced sexual activity: Not on file  Other Topics Concern   Not on file  Social History Narrative   Not on file    REVIEW OF SYSTEMS: Constitutional: Weakness, fatigue. ENT:  No nose bleeds Pulm: No trouble breathing.  No cough. CV:  No palpitations, no LE edema.  Chest pain GU: See HPI. GI: See HPI. Heme: Usual bleeding or bruising. Transfusions: Recall of previous blood transfusions. Neuro: Some positional dizziness.  No seizures.  No headaches, no peripheral tingling or numbness Derm:  No itching, no rash or sores.  Endocrine:  No sweats or chills.  No polyuria or dysuria Immunization: Did not inquire as to recent or past vaccinations. Travel: Traveled to [place] about 7 weeks ago.   PHYSICAL EXAM: Vital signs in last 24 hours: Vitals:   10/09/18 1415 10/09/18 1449  BP: (!) 145/71 (!) 152/79  Pulse: 95 99  Resp: (!) 33 18  Temp:    SpO2: 92% 95%   Wt Readings from Last 3 Encounters:  09/12/17 117.1 kg  06/22/17 116.7 kg  05/11/17 117.9 kg   General: Jaundiced, ill-appearing AAF.  Obese. Head: No facial asymmetry or swelling.  No signs of head trauma. Eyes: Scleral icterus Ears: Not hard of hearing. Nose: No congestion or discharge. Mouth: Dry oral mucosa.  Nonspecific coating on the tongue.  No lesions or exudate.  Tongue midline.  Fair dentition. Neck: No JVD, masses, thyromegaly. Lungs: Clear bilaterally.  No labored breathing or cough. Heart: RRR.  No MRG.  S1, S2 present. Abdomen: Fullness but no obvious masses in the right upper quadrant.  Minimal if any focal  tenderness at the right upper quadrant towards the waist.  Active bowel sounds..   Rectal: Deferred Musc/Skeltl: No joint redness, swelling or gross deformity. Extremities: No CCE. Neurologic: Oriented x3.  Speech is slow, some mild word finding difficulties.  No tremors, no asterixis.  Moves all 4 limbs, strength not tested.  Follows commands. Skin: Hard to appreciate jaundice with her dark skin. Tattoos: None observed Nodes: No cervical adenopathy. Psych: Cooperative, pleasant, somewhat flat affect.  Intake/Output from previous day: No intake/output data recorded. Intake/Output this shift: No intake/output data recorded.  LAB RESULTS: Recent Labs    10/09/18 1339  WBC 19.1*  HGB 14.7  HCT 45.9  PLT PLATELET CLUMPS NOTED ON SMEAR, COUNT APPEARS DECREASED   BMET Lab Results  Component Value Date   NA 132 (L) 10/09/2018   NA 141 02/14/2017   NA 138 01/18/2017   K 5.2 (H) 10/09/2018   K 3.8 02/14/2017   K 3.8 01/18/2017   CL 92 (L) 10/09/2018   CL 110 02/14/2017   CL 107 01/18/2017   CO2 19 (L) 10/09/2018   CO2 25 02/14/2017   CO2 23 01/18/2017   GLUCOSE 118 (H) 10/09/2018   GLUCOSE 105 (H) 02/14/2017   GLUCOSE 94 01/18/2017   BUN 44 (H) 10/09/2018   BUN 12 02/14/2017   BUN 10 01/18/2017   CREATININE 1.80 (H) 10/09/2018   CREATININE 0.80 02/14/2017   CREATININE 0.78 01/18/2017   CALCIUM 9.8 10/09/2018   CALCIUM 9.9 02/14/2017   CALCIUM 9.9 01/18/2017   LFT Recent Labs    10/09/18 1339  PROT 7.2  ALBUMIN 1.8*  AST 1,475*  ALT 336*  ALKPHOS 691*  BILITOT 18.5*   PT/INR Lab Results  Component Value Date   INR 1.5 (H) 10/09/2018  INR 1.07 02/23/2015   Hepatitis Panel No results for input(s): HEPBSAG, HCVAB, HEPAIGM, HEPBIGM in the last 72 hours. C-Diff No components found for: CDIFF Lipase     Component Value Date/Time   LIPASE 81 (H) 10/09/2018 1339    Drugs of Abuse  No results found for: LABOPIA, COCAINSCRNUR, LABBENZ, AMPHETMU, THCU,  LABBARB   RADIOLOGY STUDIES: Ct Abdomen Pelvis Wo Contrast  Result Date: 10/09/2018 CLINICAL DATA:  History of breast carcinoma. Dizziness, weakness, jaundice and decreased urine output. EXAM: CT ABDOMEN AND PELVIS WITHOUT CONTRAST TECHNIQUE: Multidetector CT imaging of the abdomen and pelvis was performed following the standard protocol without IV contrast. COMPARISON:  CT of the chest, abdomen and pelvis with contrast on 02/23/2015 FINDINGS: Lower chest: There is atelectasis at the right lung base with a small right pleural effusion. Hepatobiliary: The liver shows significant enlargement and very abnormal nodular parenchymal appearance which is new since 2016. This is likely consistent with involvement by diffuse metastatic disease. There is no evidence of intrahepatic biliary ductal dilatation. Pancreas: Mild edema around the body of the pancreas may relate to the presence of some free fluid around the liver. However, a component of pancreatitis cannot be entirely excluded. Spleen: Normal in size without focal abnormality. Adrenals/Urinary Tract: Adrenal glands are unremarkable. Kidneys are normal, without renal calculi, focal lesion, or hydronephrosis. Bladder is unremarkable. Stomach/Bowel: Bowel shows no evidence of obstruction or ileus. No free air. The appendix is normal. Vascular/Lymphatic: At least one prominent periportal/peripancreatic lymph node identifiable measuring approximately 1.7 cm in short axis. Multiple small nodules in the region of the gastrohepatic ligament and in the left upper quadrant may represent small gastrohepatic lymph nodes versus metastatic deposits. The largest measures approximately 10 mm. Reproductive: Mild uterine enlargement with probable underlying uterine fibroids. Other: Small amount of ascites is present predominantly adjacent to the liver but also in the pelvis. New subtle nodularity in the peritoneal fat anterior to the lower liver and extending in the right  pericolic gutter is suspicious for peritoneal spread of tumor. There are also some small mesenteric nodules suspicious for peritoneal tumor. Musculoskeletal: Subtle small lytic areas in the left iliac bone and new scalloped contour along the posterior margin of the superior endplate of L4 may be consistent with subtle metastatic lesions. The L4 abnormality could also represent a Schmorl's node. IMPRESSION: 1. New significant hepatomegaly with diffusely abnormal nodular appearance of the liver parenchyma likely consistent with involvement by diffuse metastatic disease. No evidence of biliary obstruction. 2. Mild edema around the body of the pancreas may relate to pancreatitis. 3. At least one enlarged periportal/peripancreatic lymph node as well as other findings suspicious for lymph node metastases and peritoneal carcinomatosis. 4. Small amount of ascites, predominantly around the liver. 5. Right basilar atelectasis with small right pleural effusion. Electronically Signed   By: Aletta Edouard M.D.   On: 10/09/2018 15:40   Dg Chest Portable 1 View  Result Date: 10/09/2018 CLINICAL DATA:  68 year old female with altered mental status EXAM: PORTABLE CHEST 1 VIEW COMPARISON:  02/24/2015, 02/23/2015 FINDINGS: Cardiomediastinal silhouette unchanged in size and contour. Similar appearance of asymmetric elevation the right hemidiaphragm with blunting of the right costophrenic angle. No pneumothorax. Coarsened interstitial markings similar to the prior. Surgical changes of the bilateral chest wall. IMPRESSION: Low lung volumes with right basilar opacity, potentially atelectasis/consolidation or small pleural fluid. Surgical changes of the bilateral chest wall Electronically Signed   By: Corrie Mckusick D.O.   On: 10/09/2018 12:47    IMPRESSION:   *  Jaundice.  Right flank pain.  Liver nodules suspicious for metastatic disease on CT scan. Given hx bilateral breast cancer, need to rule out mets from breast  cancer.  *       ? mild pancreatitis.  *       Elevated WBC count.  Phlebotomist obtaining blood cultures. Rocephin and Flagyl ordered.      PLAN:     *   Ordered tumor markers.  APAP level acute hepatitis panel is ordered.  Given her contact with family at a funeral in early 08/28/22 where the family member died of COVID, it may be worthwhile screening her for COVID.  *   Empiric flagyl and cipro is ordered.    *   Needs liver biopsy.  Tried to call IR but got phone message.  I went ahead and placed in IR eval/management consult request. Would avoid heparin, lovenox given need for biopsy.     Azucena Freed  10/09/2018, 3:52 PM Phone [phone]   Attending physician's note   I have taken a history, examined the patient and reviewed the chart. I agree with the Advanced Practitioner's note, impression and recommendations.  35 yr F with h/o Breast Ca s/p lumpenctomy and radiation admitted with findings suggestive metastatic disease (carcinomatosis and nodular liver) LFT abnormality is likely secondary to the metastatic liver disease Will request ultrasound guided liver biopsy to identify the primary F/u tumor markers    K. Denzil Magnuson , MD [phone]

## 2018-10-09 NOTE — ED Triage Notes (Signed)
Pt here via [facility], 2-3 weeks of dizziness and increased weakness, reports fall 3 weeks ago and hurt her right side and back, was never evaluated for the injuries.  Slow to respond, oriented with mild confusion about time. Jaundice in sclera of eyes, reports feeling dizzy today. Decreased urine and bowel output x 2 weeks. Pt has cancer but has stopped treatment.

## 2018-10-09 NOTE — ED Notes (Addendum)
Sharon Reid with Phlebotomy at bed side.

## 2018-10-09 NOTE — ED Notes (Signed)
Pt aware a urine sample is needed and will provide one when able.  

## 2018-10-09 NOTE — Progress Notes (Signed)
ED RN called, but call was lost. Awaiting call back.

## 2018-10-09 NOTE — ED Notes (Signed)
Admitting provider at bedside phlebotomy at bedside for blood culture collection

## 2018-10-09 NOTE — ED Notes (Signed)
This RN rounded on pt and asked if RN could update family/friends on her condition. Pt states RN could call Judeth Porch, her friend. This RN attempted to call, but no answer. Will attempt to call again.

## 2018-10-09 NOTE — ED Notes (Signed)
This RN, Lovena Le RN and Dorcas Mcmurray unable to get blood for ethanol, ammonia and acetaminophen level.

## 2018-10-09 NOTE — ED Notes (Signed)
IV team at bedside 

## 2018-10-09 NOTE — ED Notes (Addendum)
Pt placed on 2L [place] for tachypnea in the 30's and sats of 89-90% on RA.

## 2018-10-09 NOTE — ED Notes (Signed)
This RN spoke with [name] [phone]), pt's friend and updated her that pt is going to be admitted. Pt has given permission to give info to [name] and [name] (pt's roommate) [phone].

## 2018-10-09 NOTE — Progress Notes (Signed)
New Admission Note:  Arrival Method: Via stretcher from ED Mental Orientation: Alert & Oriented x3 Telemetry: CCMD verifed Assessment: Completed Skin: Refer to flowsheet IV: Left AC & Right FA Pain: 0/10 Safety Measures: Safety Fall Prevention Plan discussed with patient. Admission: Completed 5 [place] Orientation: Patient has been orientated to the room, unit and the staff.  Orders have been reviewed and are being implemented. Will continue to monitor the patient. Call light has been placed within reach and bed alarm has been activated.   Vassie Moselle, RN  Phone Number: [phone]

## 2018-10-10 DIAGNOSIS — Z170 Estrogen receptor positive status [ER+]: Secondary | ICD-10-CM

## 2018-10-10 DIAGNOSIS — K7290 Hepatic failure, unspecified without coma: Secondary | ICD-10-CM

## 2018-10-10 DIAGNOSIS — K7682 Hepatic encephalopathy: Secondary | ICD-10-CM

## 2018-10-10 DIAGNOSIS — R945 Abnormal results of liver function studies: Secondary | ICD-10-CM

## 2018-10-10 DIAGNOSIS — K7200 Acute and subacute hepatic failure without coma: Secondary | ICD-10-CM

## 2018-10-10 DIAGNOSIS — R160 Hepatomegaly, not elsewhere classified: Secondary | ICD-10-CM

## 2018-10-10 DIAGNOSIS — C50411 Malignant neoplasm of upper-outer quadrant of right female breast: Secondary | ICD-10-CM

## 2018-10-10 DIAGNOSIS — R7989 Other specified abnormal findings of blood chemistry: Secondary | ICD-10-CM

## 2018-10-10 LAB — COMPREHENSIVE METABOLIC PANEL
ALT: 276 U/L — ABNORMAL HIGH (ref 0–44)
ALT: 285 U/L — ABNORMAL HIGH (ref 0–44)
AST: 1064 U/L — ABNORMAL HIGH (ref 15–41)
AST: 1074 U/L — ABNORMAL HIGH (ref 15–41)
Albumin: 1.5 g/dL — ABNORMAL LOW (ref 3.5–5.0)
Albumin: 1.6 g/dL — ABNORMAL LOW (ref 3.5–5.0)
Alkaline Phosphatase: 618 U/L — ABNORMAL HIGH (ref 38–126)
Alkaline Phosphatase: 647 U/L — ABNORMAL HIGH (ref 38–126)
Anion gap: 14 (ref 5–15)
Anion gap: 13 (ref 5–15)
BUN: 46 mg/dL — ABNORMAL HIGH (ref 8–23)
BUN: 47 mg/dL — ABNORMAL HIGH (ref 8–23)
CO2: 23 mmol/L (ref 22–32)
CO2: 23 mmol/L (ref 22–32)
Calcium: 9.4 mg/dL (ref 8.9–10.3)
Calcium: 9.5 mg/dL (ref 8.9–10.3)
Chloride: 95 mmol/L — ABNORMAL LOW (ref 98–111)
Chloride: 97 mmol/L — ABNORMAL LOW (ref 98–111)
Creatinine, Ser: 1.85 mg/dL — ABNORMAL HIGH (ref 0.44–1.00)
Creatinine, Ser: 2.02 mg/dL — ABNORMAL HIGH (ref 0.44–1.00)
GFR calc Af Amer: 32 mL/min — ABNORMAL LOW (ref >60)
GFR calc Af Amer: 28 mL/min — ABNORMAL LOW (ref >60)
GFR calc non Af Amer: 27 mL/min — ABNORMAL LOW (ref >60)
GFR calc non Af Amer: 25 mL/min — ABNORMAL LOW (ref >60)
Glucose, Bld: 97 mg/dL (ref 70–99)
Glucose, Bld: 75 mg/dL (ref 70–99)
Potassium: 4.4 mmol/L (ref 3.5–5.1)
Potassium: 4.4 mmol/L (ref 3.5–5.1)
Sodium: 132 mmol/L — ABNORMAL LOW (ref 135–145)
Sodium: 133 mmol/L — ABNORMAL LOW (ref 135–145)
Total Bilirubin: 17.9 mg/dL — ABNORMAL HIGH (ref 0.3–1.2)
Total Bilirubin: 19.1 mg/dL (ref 0.3–1.2)
Total Protein: 6.4 g/dL — ABNORMAL LOW (ref 6.5–8.1)
Total Protein: 6.5 g/dL (ref 6.5–8.1)

## 2018-10-10 LAB — URINALYSIS, ROUTINE W REFLEX MICROSCOPIC
Glucose, UA: NEGATIVE mg/dL
Ketones, ur: 5 mg/dL — AB
Leukocytes,Ua: NEGATIVE
Nitrite: NEGATIVE
Protein, ur: NEGATIVE mg/dL
RBC / HPF: >50 RBC/hpf — ABNORMAL HIGH (ref 0–5)
Specific Gravity, Urine: 1.02 (ref 1.005–1.030)
pH: 5 (ref 5.0–8.0)

## 2018-10-10 LAB — GLUCOSE, CAPILLARY
Glucose-Capillary: 92 mg/dL (ref 70–99)
Glucose-Capillary: 95 mg/dL (ref 70–99)
Glucose-Capillary: 65 mg/dL — ABNORMAL LOW (ref 70–99)
Glucose-Capillary: 116 mg/dL — ABNORMAL HIGH (ref 70–99)
Glucose-Capillary: 87 mg/dL (ref 70–99)

## 2018-10-10 LAB — HEPATITIS PANEL, ACUTE
HCV Ab: <0.1 s/co ratio (ref 0.0–0.9)
Hep A IgM: NEGATIVE
Hep B C IgM: NEGATIVE
Hepatitis B Surface Ag: NEGATIVE

## 2018-10-10 LAB — PROTIME-INR
INR: 1.6 — ABNORMAL HIGH (ref 0.8–1.2)
Prothrombin Time: 18.8 seconds — ABNORMAL HIGH (ref 11.4–15.2)

## 2018-10-10 LAB — PREALBUMIN: Prealbumin: <5 mg/dL — ABNORMAL LOW (ref 18–38)

## 2018-10-10 LAB — AMMONIA: Ammonia: 108 umol/L — ABNORMAL HIGH (ref 9–35)

## 2018-10-10 MED ORDER — PHYTONADIONE 5 MG PO TABS
5.0000 mg | ORAL_TABLET | Freq: Every day | ORAL | Status: DC
  Administered 2018-10-10 – 2018-10-11 (×2): 5 mg via ORAL
  Filled 2018-10-10 (×4): qty 1

## 2018-10-10 MED ORDER — ALBUTEROL SULFATE (2.5 MG/3ML) 0.083% IN NEBU: 2.5000 mg | INHALATION_SOLUTION | RESPIRATORY_TRACT | Status: DC | PRN

## 2018-10-10 MED ORDER — LACTULOSE 10 GM/15ML PO SOLN
20.0000 g | Freq: Two times a day (BID) | ORAL | Status: DC
  Administered 2018-10-10 – 2018-10-11 (×3): 20 g via ORAL
  Filled 2018-10-10 (×3): qty 30

## 2018-10-10 NOTE — Progress Notes (Signed)
Patient with history of breast cancer now with elevated LFTs, jaundice, and acute liver and renal failure.   CT Abdomen/Pelvis:  1. New significant hepatomegaly with diffusely abnormal nodular appearance of the liver parenchyma likely consistent with involvement by diffuse metastatic disease. No evidence of biliary obstruction. 2. Mild edema around the body of the pancreas may relate to pancreatitis. 3. At least one enlarged periportal/peripancreatic lymph node as well as other findings suspicious for lymph node metastases and peritoneal carcinomatosis. 4. Small amount of ascites, predominantly around the liver. 5. Right basilar atelectasis with small right pleural effusion.  Request made for liver biopsy.  Case reviewed by Dr. Anselm Pancoast who notes concern for acute illness (Tbili 19.1, INR 1.6, WBC 19.1, renal failure) along with ascites which impedes ability for percutaneous liver biopsy. Unable to undergo MRI due to renal function. Recommends proceeding with transjugular liver biopsy if congruent with goals of primary team. Dr. Anselm Pancoast has discussed with GI attending who is agreeable.  Plan to proceed with transjugular liver biopsy tomorrow in IR.   Patient NPO p MN.  Per RN, patient is consentable.   Brynda Greathouse, MS RD PA-C 3:12 PM

## 2018-10-10 NOTE — Progress Notes (Signed)
Critical lab value for Total bili 19.1. MD notified thru Amnion.

## 2018-10-10 NOTE — Progress Notes (Deleted)
Dr Anselm Pancoast from IR phoned.  Does not feel comfortable performing liver biopsy.    Might consider trans jugular biopsy.  Asked that we obtain a MRI abdomen with/without contrast.  I ordered this  Azucena Freed PA-C

## 2018-10-10 NOTE — Progress Notes (Addendum)
HEMATOLOGY-ONCOLOGY PROGRESS NOTE  SUBJECTIVE: 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    Her previous treatment as outlined below.  The patient presented to the emergency room for worsening weakness and right-sided abdominal pain.  Work-up in the emergency room was significant for an elevated white blood cell count at 19.1, sodium 132, potassium 5.2, BUN 44, creatinine 1.8, albumin 1.8, lipase 81, AST 1475, ALT 336, total bilirubin 18.5.  CT scan of the abdomen and pelvis was performed without contrast which showed new significant hepatomegaly with diffusely abnormal nodular appearance of the liver parenchyma likely consistent with involvement by diffuse metastatic disease.  There was no evidence of biliary obstruction.  The patient has mild edema around the body of the pancreas which may relate to pancreatitis.  She has at least one enlarged periportal/peripancreatic lymph node as well as other findings suspicious for lymph node metastases and peritoneal carcinomatosis.  There is a small amount of ascites noted.  She also has a small right pleural effusion.  Liver ultrasound again showed a nodular contour consistent with hepatic cirrhosis and multiple lesions noted throughout the liver likely metastatic disease.  There was sludge noted in the gallbladder.  No gallstones were evident.  The patient states that she has been having weakness particularly in her lower extremities for the past 3 weeks.  She started developing abdominal pain more recently.  Denies headaches and visual changes.  Denies chest  discomfort and shortness of breath.  No cough or hemoptysis.  She denies having nausea or vomiting.  No bowel changes.  Denies bleeding.  She denies having any changes to her bilateral  breasts.  Reports that she has had a decreased appetite and is unsure if she has lost weight or not.  Oncology was asked to see the patient given findings noted on recent imaging.    Malignant neoplasm of upper-outer quadrant of right breast in female, estrogen receptor positive ([place])   10/28/2016 Initial Diagnosis    Screening detected right breast distortion at UOQ 10:00: 2.4 cm with abnormal lymph node in the axilla, biopsy invasive lobular cancer with LCIS ER 90%, PR 80%, Ki-67 10%, HER-2 negative ratio 1.48, lymph node biopsy also positive T2 N0 stage II a (New AJCC)    10/28/2016 Miscellaneous    Mammaprint low-risk luminal type A    11/28/2016 Breast MRI    malignancy in the retroareolar upper outer quadrant of the right breast 1.7 x 1.0 x 1.5 cm single right axillary lymph node, Suspicious clumped linear enhancement in the upper-outer quadrant of the LEFT breast    01/24/2017 Surgery    Bilateral lumpectomies: Left: DCIS grade 3, 3.6 cm, 4.8 cm, with microinvasion, margins negative, 1/2 lymph nodes positive ER 0%, PR 0% Her 2 Positive; right: ILC grade 2, 1.8 cm, margin of resection focally positive anterior cauterized margin, 2/3 lymph nodes positive, ER 90%, PR 80%, HER-2 negative, Ki-67 10%     Chemotherapy    Patient refused chemotherapy for the HER-2 positive breast cancer     03/29/2017 - 05/11/2017 Radiation Therapy    Adjuvant radiation therapy    06/2017 -  Anti-estrogen oral therapy    Letrozole daily     REVIEW OF SYSTEMS:   Constitutional: Denies fevers, chills.  Reports anorexia.  She does not  know if she has lost weight.  Reports generalized weakness and fatigue. Eyes: Denies blurriness of vision.  Reports yellowing of her eyes. Ears, nose, mouth, throat, and face: Denies mucositis or sore throat Respiratory: Denies cough, dyspnea or wheezes Cardiovascular: Denies palpitation, chest discomfort Gastrointestinal:  Denies nausea, heartburn or change in bowel habits.   Reports abdominal pain. Skin: Denies abnormal skin rashes Lymphatics: Denies new lymphadenopathy or easy bruising Neurological: Denies numbness, tingling Behavioral/Psych: Mood is stable, no new changes  Extremities: No lower extremity edema Breast: Denies any pain or lumps or nodules in either breasts All other systems were reviewed with the patient and are negative.  I have reviewed the past medical history, past surgical history, social history and family history with the patient and they are unchanged from previous note.   PHYSICAL EXAMINATION: ECOG PERFORMANCE STATUS: 2 - Symptomatic, <50% confined to bed  Vitals:   10/10/18 0745 10/10/18 0802  BP: 130/64   Pulse: 85   Resp: 19   Temp: 98 F (36.7 C)   SpO2: 96% 96%   GENERAL:alert, restless at times SKIN: Dry skin, no rashes or significant lesions EYES: Icteric OROPHARYNX:no exudate, no erythema and lips, buccal mucosa, and tongue normal  NECK: supple, thyroid normal size, non-tender, without nodularity LYMPH:  no palpable lymphadenopathy in the cervical, axillary or inguinal LUNGS: clear to auscultation and percussion with normal breathing effort HEART: regular rate & rhythm and no murmurs and no lower extremity edema ABDOMEN:abdomen soft, non-tender and normal bowel sounds Musculoskeletal:no cyanosis of digits and no clubbing  NEURO: Intermittent confusion, no focal motor/sensory deficits  LABORATORY DATA:  I have reviewed the data as listed CMP Latest Ref Rng & Units 10/10/2018 10/09/2018 02/14/2017  Glucose 70 - 99 mg/dL 97 118(H) 105(H)  BUN 8 - 23 mg/dL 46(H) 44(H) 12  Creatinine 0.44 - 1.00 mg/dL 1.85(H) 1.80(H) 0.80  Sodium 135 - 145 mmol/L 132(L) 132(L) 141  Potassium 3.5 - 5.1 mmol/L 4.4 5.2(H) 3.8  Chloride 98 - 111 mmol/L 95(L) 92(L) 110  CO2 22 - 32 mmol/L 23 19(L) 25  Calcium 8.9 - 10.3 mg/dL 9.4 9.8 9.9  Total Protein 6.5 - 8.1 g/dL 6.4(L) 7.2 -  Total Bilirubin 0.3 - 1.2 mg/dL 17.9(H) 18.5(HH) -   Alkaline Phos 38 - 126 U/L 618(H) 691(H) -  AST 15 - 41 U/L 1,064(H) 1,475(H) -  ALT 0 - 44 U/L 276(H) 336(H) -    Lab Results  Component Value Date   WBC 19.1 (H) 10/09/2018   HGB 14.7 10/09/2018   HCT 45.9 10/09/2018   MCV 93.7 10/09/2018   PLT  10/09/2018    PLATELET CLUMPS NOTED ON SMEAR, COUNT APPEARS DECREASED   NEUTROABS 15.5 (H) 10/09/2018    ASSESSMENT AND PLAN: 1.  Metastases to the liver, elevated LFTs, and jaundice -The patient appears to have diffuse metastatic disease in her liver with peritoneal spread, lymphadenopathy and mild ascites. -She has significantly elevated LFTs and total bilirubin -CEA, CA 19.9, AFP, and CA 125 have been ordered and are pending  2.  Acute hepatic encephalopathy -Hepatitis panel negative -Likely due to her liver dysfunction. -Gastroenterology is starting her on lactulose  3.  Coagulopathy -INR elevated likely due to liver pathology -Vitamin K has been ordered  4.  Leukocytosis -She remains afebrile -Blood cultures are negative to date -Monitor CBC and consider antibiotics if any signs of infection or positive blood cultures  5.  Acute renal failure -Creatinine elevated on admission -Continue IV fluids -Monitor  renal function  Mikey Bussing, DNP, AGPCNP-BC, AOCNP   Attending Note  I personally saw the patient, reviewed the chart and examined the patient. The plan of care was discussed with the patient and her power of attorney who is her friend. I agree with the assessment and plan as documented above.  1.  Metastatic carcinoma most likely breast primary.  Previously it was estrogen receptor positive and HER-2 positive.  Very extensive involvement of the liver with market elevation of LFTs and ammonia level.  Goals of care counseling: I discussed with the patient's friend who is her power of attorney.  She had previously refused chemotherapy and that appears to be the current opinion as well. Without systemic chemotherapy  patient would have only every few weeks to live.  Given the lack of interest in systemic treatment, we have to measure the risks and benefits of pursuing any biopsies.  Given the elevated INR and issues regarding risks with biopsies, it is reasonable to avoid any further interventions and consult hospice.  Patient's power of attorney would like to discuss this in more detail with the patient and come up with a final decision.  I will request Wadie Lessen to see her tomorrow and counsel her.  2.  Liver failure: Due to extensive liver metastases. I also do not recommend obtaining a brain MRI because of the palliative nature of our treatment plan.   Thank you very much for the consultation.

## 2018-10-10 NOTE — TOC Initial Note (Signed)
Transition of Care [facility]) - Initial/Assessment Note    Patient Details  Name: Sharon Reid MRN: [id number] Date of Birth: [birth year]  Transition of Care [facility]) CM/SW Contact:    Bartholomew Crews, RN Phone Number: [phone] 10/10/2018, 4:37 PM  Clinical Narrative:                 Spoke with patient at bedside. PTA home with roommate. States roommate will assist her as needed. Says she will have transportation home "tomorrow." Advised that discharge day had not yet been determined. Patient drowsy and wanting to nap. CM to follow for transition of care needs.   Expected Discharge Plan: Home/Self Care Barriers to Discharge: Continued Medical Work up   Patient Goals and CMS Choice        Expected Discharge Plan and Services Expected Discharge Plan: Home/Self Care   Discharge Planning Services: CM Consult                                          Prior Living Arrangements/Services   Lives with:: Self, Roommate   Do you feel safe going back to the place where you live?: Yes               Activities of Daily Living Home Assistive Devices/Equipment: None ADL Screening (condition at time of admission) Patient's cognitive ability adequate to safely complete daily activities?: Yes Is the patient deaf or have difficulty hearing?: No Does the patient have difficulty seeing, even when wearing glasses/contacts?: No Does the patient have difficulty concentrating, remembering, or making decisions?: No Patient able to express need for assistance with ADLs?: Yes Does the patient have difficulty dressing or bathing?: No Independently performs ADLs?: Yes (appropriate for developmental age) Does the patient have difficulty walking or climbing stairs?: Yes Weakness of Legs: Both Weakness of Arms/Hands: Both  Permission Sought/Granted   Permission granted to share information with : No              Emotional Assessment Appearance:: Appears stated  age Attitude/Demeanor/Rapport: Lethargic Affect (typically observed): Accepting Orientation: : Oriented to Self, Oriented to  Time, Oriented to Place, Oriented to Situation      Admission diagnosis:  ARF (acute renal failure) ([facility]) [N17.9] Pleural effusion [J90] RUQ abdominal pain [R10.11] Disorientation [R41.0] Liver mass [R16.0] Elevated LFTs [R94.5] AKI (acute kidney injury) ([place]) [N17.9] Patient Active Problem List   Diagnosis Date Noted  . Acute hepatic encephalopathy 10/10/2018  . Jaundice 10/09/2018  . Hyperkalemia 10/09/2018  . Metastases to the liver ([place]) 10/09/2018  . Hypoglycemia 10/09/2018  . Hepatic cirrhosis ([place]) 10/09/2018  . Acute renal failure (ARF) ([employer]) 10/09/2018  . Dehydration 10/09/2018  . Leukocytosis 10/09/2018  . Hyperbilirubinemia 10/09/2018  . Breast cancer of upper-outer quadrant of left female breast ([place]) 02/27/2017  . Malignant neoplasm of upper-outer quadrant of right breast in female, estrogen receptor positive ([place]) 11/03/2016  . Blunt chest trauma 02/24/2015  . Right rib fracture 02/24/2015  . Contusion, chest wall 02/23/2015   PCP:  Nolene Ebbs, MD Pharmacy:   [facility] [place], [place] - [address] AT [address] [place] [address] Phone: [phone] Fax: [phone]     Social Determinants of Health (SDOH) Interventions    Readmission Risk Interventions No flowsheet data found.

## 2018-10-10 NOTE — Progress Notes (Addendum)
Daily Rounding Note  10/10/2018, 10:33 AM  LOS: 1 day   SUBJECTIVE:   Chief complaint: Jaundice.  Nodular liver, liver lesions suspicious for mets.    Complaining of mild to moderate pain in left upper abdomen.  No N/V.  Main significantly fatigued. Has not had a bowel movement.  Does not feel like she needs a laxative.  No nausea.  Appetite poor.  OBJECTIVE:         Vital signs in last 24 hours:    Temp:  [98 F (36.7 C)-99 F (37.2 C)] 98 F (36.7 C) (04/29 0745) Pulse Rate:  [84-100] 85 (04/29 0745) Resp:  [15-33] 19 (04/29 0745) BP: (117-152)/(57-94) 130/64 (04/29 0745) SpO2:  [90 %-99 %] 96 % (04/29 0802) Last BM Date: 10/08/18 There were no vitals filed for this visit. General: Alert.  Jaundiced.  Comfortable.  Slightly restless. Heart: RRR.  Rate in the 90s. Chest: Clear bilaterally.  No labored breathing or cough. Abdomen: Soft.  Not tender.  Obese.  Bowel sounds hypoactive. Extremities: No CCE. Neuro/Psych: Alert.  Appropriate.  Speech slow but accurate.  No asterixis.  Moves all 4 limbs.  Intake/Output from previous day: 04/28 0701 - 04/29 0700 In: 0  Out: 100 [Urine:100]  Intake/Output this shift: Total I/O In: 60 [P.O.:60] Out: -   Lab Results: Recent Labs    10/09/18 1339  WBC 19.1*  HGB 14.7  HCT 45.9  PLT PLATELET CLUMPS NOTED ON SMEAR, COUNT APPEARS DECREASED   BMET Recent Labs    10/09/18 1339 10/10/18 0512  NA 132* 132*  K 5.2* 4.4  CL 92* 95*  CO2 19* 23  GLUCOSE 118* 97  BUN 44* 46*  CREATININE 1.80* 1.85*  CALCIUM 9.8 9.4   LFT Recent Labs    10/09/18 1339 10/10/18 0512  PROT 7.2 6.4*  ALBUMIN 1.8* 1.5*  AST 1,475* 1,064*  ALT 336* 276*  ALKPHOS 691* 618*  BILITOT 18.5* 17.9*   PT/INR Recent Labs    10/09/18 1339 10/10/18 0512  LABPROT 17.8* 18.8*  INR 1.5* 1.6*   Hepatitis Panel Recent Labs    10/09/18 1438  HEPBSAG Negative  HCVAB <0.1  HEPAIGM  Negative  HEPBIGM Negative    Studies/Results: Ct Abdomen Pelvis Wo Contrast  Result Date: 10/09/2018 CLINICAL DATA:  History of breast carcinoma. Dizziness, weakness, jaundice and decreased urine output. EXAM: CT ABDOMEN AND PELVIS WITHOUT CONTRAST TECHNIQUE: Multidetector CT imaging of the abdomen and pelvis was performed following the standard protocol without IV contrast. COMPARISON:  CT of the chest, abdomen and pelvis with contrast on 02/23/2015 FINDINGS: Lower chest: There is atelectasis at the right lung base with a small right pleural effusion. Hepatobiliary: The liver shows significant enlargement and very abnormal nodular parenchymal appearance which is new since 2016. This is likely consistent with involvement by diffuse metastatic disease. There is no evidence of intrahepatic biliary ductal dilatation. Pancreas: Mild edema around the body of the pancreas may relate to the presence of some free fluid around the liver. However, a component of pancreatitis cannot be entirely excluded. Spleen: Normal in size without focal abnormality. Adrenals/Urinary Tract: Adrenal glands are unremarkable. Kidneys are normal, without renal calculi, focal lesion, or hydronephrosis. Bladder is unremarkable. Stomach/Bowel: Bowel shows no evidence of obstruction or ileus. No free air. The appendix is normal. Vascular/Lymphatic: At least one prominent periportal/peripancreatic lymph node identifiable measuring approximately 1.7 cm in short axis. Multiple small nodules in the region of the  gastrohepatic ligament and in the left upper quadrant may represent small gastrohepatic lymph nodes versus metastatic deposits. The largest measures approximately 10 mm. Reproductive: Mild uterine enlargement with probable underlying uterine fibroids. Other: Small amount of ascites is present predominantly adjacent to the liver but also in the pelvis. New subtle nodularity in the peritoneal fat anterior to the lower liver and extending  in the right pericolic gutter is suspicious for peritoneal spread of tumor. There are also some small mesenteric nodules suspicious for peritoneal tumor. Musculoskeletal: Subtle small lytic areas in the left iliac bone and new scalloped contour along the posterior margin of the superior endplate of L4 may be consistent with subtle metastatic lesions. The L4 abnormality could also represent a Schmorl's node. IMPRESSION: 1. New significant hepatomegaly with diffusely abnormal nodular appearance of the liver parenchyma likely consistent with involvement by diffuse metastatic disease. No evidence of biliary obstruction. 2. Mild edema around the body of the pancreas may relate to pancreatitis. 3. At least one enlarged periportal/peripancreatic lymph node as well as other findings suspicious for lymph node metastases and peritoneal carcinomatosis. 4. Small amount of ascites, predominantly around the liver. 5. Right basilar atelectasis with small right pleural effusion. Electronically Signed   By: Aletta Edouard M.D.   On: 10/09/2018 15:40   Dg Chest Portable 1 View  Result Date: 10/09/2018 CLINICAL DATA:  68 year old female with altered mental status EXAM: PORTABLE CHEST 1 VIEW COMPARISON:  02/24/2015, 02/23/2015 FINDINGS: Cardiomediastinal silhouette unchanged in size and contour. Similar appearance of asymmetric elevation the right hemidiaphragm with blunting of the right costophrenic angle. No pneumothorax. Coarsened interstitial markings similar to the prior. Surgical changes of the bilateral chest wall. IMPRESSION: Low lung volumes with right basilar opacity, potentially atelectasis/consolidation or small pleural fluid. Surgical changes of the bilateral chest wall Electronically Signed   By: Corrie Mckusick D.O.   On: 10/09/2018 12:47   US Abdomen Limited Ruq  Result Date: 10/09/2018 CLINICAL DATA:  Breast carcinoma. Upper abdominal pain with jaundice EXAM: ULTRASOUND ABDOMEN LIMITED RIGHT UPPER QUADRANT  COMPARISON:  CT abdomen and pelvis October 09, 2018 FINDINGS: Gallbladder: There is sludge in the gallbladder. No gallstones are evident. There is diffuse gallbladder wall thickening. There is no pericholecystic fluid. No sonographic Murphy sign noted by sonographer. Common bile duct: Diameter: 4 mm. No intrahepatic or extrahepatic biliary duct dilatation. Liver: Multiple lesions are noted throughout the liver, likely widespread metastatic disease. Liver has a nodular contour consistent with a degree of underlying hepatic cirrhosis. Portal vein is patent on color Doppler imaging with normal direction of blood flow towards the liver. There is mild ascites. There is a right pleural effusion. IMPRESSION: 1. Liver has a nodular contour consistent with hepatic cirrhosis. Multiple lesions are noted throughout the liver, likely metastatic disease. 2. Sludge is noted in the gallbladder. No gallstones evident. There is gallbladder wall thickening. There is ascites which may result in gallbladder wall thickening. A degree of acalculus cholecystitis, however, cannot be excluded in this circumstance. 3.  Right pleural effusion.  Mild ascites noted. Electronically Signed   By: Lowella Grip III M.D.   On: 10/09/2018 17:35   Scheduled Meds:  lactulose  20 g Oral BID   sodium chloride flush  3 mL Intravenous Q12H   Continuous Infusions: PRN Meds:.albuterol, ondansetron **OR** ondansetron (ZOFRAN) IV, oxyCODONE   ASSESMENT:   *   Jaundice, nodular liver consistent with cirrhosis. Ascites.  Non-specific GB wall thickening.  In addition, liver lesions suspicious for mets in patient  with history of bilateral breast cancer. Hepatitis A IgM, hepatitis B surface Ag, hepatitis B core Ab IgM, Hep C Ab all negative.   Liver biopsy ordered, not completed.  LFTs still significantly elevated but improved compared with yesterday.  *    Leukocytosis without fever.  Right pleural effusion.  Blood clx in progress.   Received  Rocephin and Flagyl yesterday but this is not ongoing.  *     AKI.  *     Elevated ammonia level, 108.  Hospitalist ordered lactulose starting today.  *      Elevated CK  *   Hyponatremia.  *      Mild hypokalemia   PLAN   *    Waiting on liver biopsy and subsequent pathology reading.  Waiting on pndg tumor markers.    *    Going to give her vitamin K 5 mg p.o. daily for 5 days.    Azucena Freed  10/10/2018, 10:33 AM Phone [phone]   Attending physician's note   I have taken an interval history, reviewed the chart and examined the patient. I agree with the Advanced Practitioner's note, impression and recommendations.   CT findings , mixed LFT pattern with obstructive jaundice and hepatocellular injury, elevated INR concerning for infiltrative metastatic liver disease, ? Primary breast cancer.    Platelet count is normal, no history of cirrhosis or portal hypertensive disease  Will need liver biopsy for definitive diagnosis metastatic cancer with liver infiltration versus primary liver disease  Patient is a Sales promotion account executive Witness Discussed with Dr. Anselm Pancoast (interventional radiologist), he feels patient is at risk for potential complications with transcutaneous biopsy and is agreeable to proceed with transjugular liver biopsy Tentatively planned for tomorrow  Vitamin K  K. Denzil Magnuson , MD [phone]

## 2018-10-10 NOTE — Progress Notes (Addendum)
- PROGRESS NOTE    Sharon Reid   YIR:[id number]  DOB: Nov 02, 1950  DOA: 10/09/2018 PCP: Nolene Ebbs, MD   Brief Narrative:  Sharon Reid is a 68 y.o. female with medical history significant of bilateral breast cancer s/p lumpectomy and radiation currently on anasrozole , HTN, Jehovah witness, and GERD; who presented for worsening weakness over the last 3 weeks and right sided abdominal pain for about 1 month now.  Noted to be jaundiced in the ED with elevated LFTs.  WBC 19.1, platelets decreased, sodium 132, potassium 5.2, chloride 92, CO2 19, BUN 44, creatinine 1.8, albumin 1.8, lipase 81, AST 1475, ALT 336, total bilirubin 18.5, ammonia 44, and INR 1.5. CT abd/pelvis> Liver has a nodular contour consistent with hepatic cirrhosis. Multiple lesions are noted throughout the liver, likely metastatic disease. 2. Sludge is noted in the gallbladder. No gallstones evident. There is gallbladder wall thickening. There is ascites which may result in gallbladder wall thickening.   Subjective: She currently is having some pain in her left upper abdomen which is mild to moderate. No nausea or vomiting. She states she is tired multiple times.     Assessment & Plan:   Principal Problem:   Metastases to the liver, elevated LFTS, Jaundice - liver is enlarged and appears to have diffuse metastatic disease with peritoneal spread, lymphadenopathy and mild ascites - h/o breast CA in 2018 - declined chemo/radaition - needs biopsy- we have consulted IR - GI and Oncology has been consulted    Active Problems:   Acute hepatic encephalopathy - seems a little sleepy and is having trouble remembering the events of yesterday- she cannot remember having a CT scan or ultrasound  - will start Lactulose  Coagulopathy - has elevated INR likely due to above liver pathology  Stage 1a bilateral breast CA HER 2 + - diagnosed on 10/28/16 - treated with lumpectomies in 01/14/2017 - she refused  chemo/radiation - have consulted Dr Lindi Adie    Hyperkalemia - has improved    Acute renal failure (ARF)  - Cr 1.80 on admission with BUN of 44 - last CR was 0.80 when last checked in 02/2017  - cont slow IVF- currently receiving D51/2 NS at 100 cc/hr  Leukocytosis - likely stress demargination- no signs of infection - blood cultures have been drawn- given Ceftriaxone and Flagyl in ED but not being continued as no infection found at this time    Hypoglycemia - CBG 30 on admission- has improved- cont to follow    Hepatic cirrhosis   - noted on imaging  Hematuria - repeat UA- CT of abdomen/pelvis shows no abnormalities in renal system    Time spent in minutes: 35 min DVT prophylaxis: SCDS Code Status: Full code Family Communication: she has no family locally- contact listed for friend, Scharlene Gloss Disposition Plan: f/u on biopsy Consultants:   GI  IR  Oncology consulted today Procedures:   none Antimicrobials:  Anti-infectives (From admission, onward)   Start     Dose/Rate Route Frequency Ordered Stop   10/09/18 1545  cefTRIAXone (ROCEPHIN) 2 g in sodium chloride 0.9 % 100 mL IVPB     2 g 200 mL/hr over 30 Minutes Intravenous  Once 10/09/18 1535 10/09/18 1943   10/09/18 1545  metroNIDAZOLE (FLAGYL) IVPB 500 mg     500 mg 100 mL/hr over 60 Minutes Intravenous  Once 10/09/18 1535 10/09/18 2046       Objective: Vitals:   10/09/18 2014 10/10/18 0553 10/10/18 0745 10/10/18  0802  BP: (!) 146/72 (!) 121/57 130/64   Pulse: 90 89 85   Resp: 18 18 19    Temp: 99 F (37.2 C) 98 F (36.7 C) 98 F (36.7 C)   TempSrc: Oral Oral Oral   SpO2: 90% 95% 96% 96%    Intake/Output Summary (Last 24 hours) at 10/10/2018 0948 Last data filed at 10/10/2018 0920 Gross per 24 hour  Intake 60 ml  Output 100 ml  Net -40 ml   There were no vitals filed for this visit.  Examination: General exam: Appears exhausted and slightly uncomfortable   HEENT: PERRLA, oral mucosa moist,  + sclera icterus   Respiratory system: Clear to auscultation. Respiratory effort normal. Cardiovascular system: S1 & S2 heard, RRR.   Gastrointestinal system: Abdomen soft,  Tender in RUQ, nondistended. Normal bowel sounds. Central nervous system: Alert and oriented. No focal neurological deficits. Extremities: No cyanosis, clubbing or edema Skin: No rashes or ulcers Psychiatry:  Mood & affect appropriate.     Data Reviewed: I have personally reviewed following labs and imaging studies  CBC: Recent Labs  Lab 10/09/18 1339  WBC 19.1*  NEUTROABS 15.5*  HGB 14.7  HCT 45.9  MCV 93.7  PLT PLATELET CLUMPS NOTED ON SMEAR, COUNT APPEARS DECREASED   Basic Metabolic Panel: Recent Labs  Lab 10/09/18 1339 10/10/18 0512  NA 132* 132*  K 5.2* 4.4  CL 92* 95*  CO2 19* 23  GLUCOSE 118* 97  BUN 44* 46*  CREATININE 1.80* 1.85*  CALCIUM 9.8 9.4   GFR: CrCl cannot be calculated (Unknown ideal weight.). Liver Function Tests: Recent Labs  Lab 10/09/18 1339 10/10/18 0512  AST 1,475* 1,064*  ALT 336* 276*  ALKPHOS 691* 618*  BILITOT 18.5* 17.9*  PROT 7.2 6.4*  ALBUMIN 1.8* 1.5*   Recent Labs  Lab 10/09/18 1339  LIPASE 81*   Recent Labs  Lab 10/09/18 1339 10/10/18 0512  AMMONIA 44* 108*   Coagulation Profile: Recent Labs  Lab 10/09/18 1339 10/10/18 0512  INR 1.5* 1.6*   Cardiac Enzymes: Recent Labs  Lab 10/09/18 1925  CKTOTAL 297*   BNP (last 3 results) No results for input(s): PROBNP in the last 8760 hours. HbA1C: No results for input(s): HGBA1C in the last 72 hours. CBG: Recent Labs  Lab 10/09/18 1345 10/09/18 1446 10/09/18 2047 10/10/18 0552 10/10/18 0738  GLUCAP 94 102* 97 92 95   Lipid Profile: No results for input(s): CHOL, HDL, LDLCALC, TRIG, CHOLHDL, LDLDIRECT in the last 72 hours. Thyroid Function Tests: No results for input(s): TSH, T4TOTAL, FREET4, T3FREE, THYROIDAB in the last 72 hours. Anemia Panel: No results for input(s):  VITAMINB12, FOLATE, FERRITIN, TIBC, IRON, RETICCTPCT in the last 72 hours. Urine analysis:    Component Value Date/Time   COLORURINE BROWN (A) 10/10/2018 0553   APPEARANCEUR TURBID (A) 10/10/2018 0553   LABSPEC 1.020 10/10/2018 0553   PHURINE 5.0 10/10/2018 0553   GLUCOSEU NEGATIVE 10/10/2018 0553   HGBUR LARGE (A) 10/10/2018 0553   BILIRUBINUR MODERATE (A) 10/10/2018 0553   KETONESUR 5 (A) 10/10/2018 0553   PROTEINUR NEGATIVE 10/10/2018 0553   NITRITE NEGATIVE 10/10/2018 0553   LEUKOCYTESUR NEGATIVE 10/10/2018 0553   Sepsis Labs: @LABRCNTIP (procalcitonin:4,lacticidven:4) )No results found for this or any previous visit (from the past 240 hour(s)).       Radiology Studies: Ct Abdomen Pelvis Wo Contrast  Result Date: 10/09/2018 CLINICAL DATA:  History of breast carcinoma. Dizziness, weakness, jaundice and decreased urine output. EXAM: CT ABDOMEN AND PELVIS WITHOUT CONTRAST  TECHNIQUE: Multidetector CT imaging of the abdomen and pelvis was performed following the standard protocol without IV contrast. COMPARISON:  CT of the chest, abdomen and pelvis with contrast on 02/23/2015 FINDINGS: Lower chest: There is atelectasis at the right lung base with a small right pleural effusion. Hepatobiliary: The liver shows significant enlargement and very abnormal nodular parenchymal appearance which is new since 2016. This is likely consistent with involvement by diffuse metastatic disease. There is no evidence of intrahepatic biliary ductal dilatation. Pancreas: Mild edema around the body of the pancreas may relate to the presence of some free fluid around the liver. However, a component of pancreatitis cannot be entirely excluded. Spleen: Normal in size without focal abnormality. Adrenals/Urinary Tract: Adrenal glands are unremarkable. Kidneys are normal, without renal calculi, focal lesion, or hydronephrosis. Bladder is unremarkable. Stomach/Bowel: Bowel shows no evidence of obstruction or ileus. No  free air. The appendix is normal. Vascular/Lymphatic: At least one prominent periportal/peripancreatic lymph node identifiable measuring approximately 1.7 cm in short axis. Multiple small nodules in the region of the gastrohepatic ligament and in the left upper quadrant may represent small gastrohepatic lymph nodes versus metastatic deposits. The largest measures approximately 10 mm. Reproductive: Mild uterine enlargement with probable underlying uterine fibroids. Other: Small amount of ascites is present predominantly adjacent to the liver but also in the pelvis. New subtle nodularity in the peritoneal fat anterior to the lower liver and extending in the right pericolic gutter is suspicious for peritoneal spread of tumor. There are also some small mesenteric nodules suspicious for peritoneal tumor. Musculoskeletal: Subtle small lytic areas in the left iliac bone and new scalloped contour along the posterior margin of the superior endplate of L4 may be consistent with subtle metastatic lesions. The L4 abnormality could also represent a Schmorl's node. IMPRESSION: 1. New significant hepatomegaly with diffusely abnormal nodular appearance of the liver parenchyma likely consistent with involvement by diffuse metastatic disease. No evidence of biliary obstruction. 2. Mild edema around the body of the pancreas may relate to pancreatitis. 3. At least one enlarged periportal/peripancreatic lymph node as well as other findings suspicious for lymph node metastases and peritoneal carcinomatosis. 4. Small amount of ascites, predominantly around the liver. 5. Right basilar atelectasis with small right pleural effusion. Electronically Signed   By: Aletta Edouard M.D.   On: 10/09/2018 15:40   Dg Chest Portable 1 View  Result Date: 10/09/2018 CLINICAL DATA:  68 year old female with altered mental status EXAM: PORTABLE CHEST 1 VIEW COMPARISON:  02/24/2015, 02/23/2015 FINDINGS: Cardiomediastinal silhouette unchanged in size and  contour. Similar appearance of asymmetric elevation the right hemidiaphragm with blunting of the right costophrenic angle. No pneumothorax. Coarsened interstitial markings similar to the prior. Surgical changes of the bilateral chest wall. IMPRESSION: Low lung volumes with right basilar opacity, potentially atelectasis/consolidation or small pleural fluid. Surgical changes of the bilateral chest wall Electronically Signed   By: Corrie Mckusick D.O.   On: 10/09/2018 12:47   US Abdomen Limited Ruq  Result Date: 10/09/2018 CLINICAL DATA:  Breast carcinoma. Upper abdominal pain with jaundice EXAM: ULTRASOUND ABDOMEN LIMITED RIGHT UPPER QUADRANT COMPARISON:  CT abdomen and pelvis October 09, 2018 FINDINGS: Gallbladder: There is sludge in the gallbladder. No gallstones are evident. There is diffuse gallbladder wall thickening. There is no pericholecystic fluid. No sonographic Murphy sign noted by sonographer. Common bile duct: Diameter: 4 mm. No intrahepatic or extrahepatic biliary duct dilatation. Liver: Multiple lesions are noted throughout the liver, likely widespread metastatic disease. Liver has a nodular contour  consistent with a degree of underlying hepatic cirrhosis. Portal vein is patent on color Doppler imaging with normal direction of blood flow towards the liver. There is mild ascites. There is a right pleural effusion. IMPRESSION: 1. Liver has a nodular contour consistent with hepatic cirrhosis. Multiple lesions are noted throughout the liver, likely metastatic disease. 2. Sludge is noted in the gallbladder. No gallstones evident. There is gallbladder wall thickening. There is ascites which may result in gallbladder wall thickening. A degree of acalculus cholecystitis, however, cannot be excluded in this circumstance. 3.  Right pleural effusion.  Mild ascites noted. Electronically Signed   By: Lowella Grip III M.D.   On: 10/09/2018 17:35      Scheduled Meds: . lactulose  20 g Oral BID  . sodium  chloride flush  3 mL Intravenous Q12H   Continuous Infusions:   LOS: 1 day      Debbe Odea, MD [facility] Pager: [email or web address] Password [email or web address] 10/10/2018, 9:48 AM

## 2018-10-11 DIAGNOSIS — Z66 Do not resuscitate: Secondary | ICD-10-CM

## 2018-10-11 DIAGNOSIS — Z515 Encounter for palliative care: Secondary | ICD-10-CM

## 2018-10-11 DIAGNOSIS — R945 Abnormal results of liver function studies: Secondary | ICD-10-CM

## 2018-10-11 DIAGNOSIS — K7201 Acute and subacute hepatic failure with coma: Secondary | ICD-10-CM

## 2018-10-11 DIAGNOSIS — G893 Neoplasm related pain (acute) (chronic): Secondary | ICD-10-CM

## 2018-10-11 LAB — COMPREHENSIVE METABOLIC PANEL
ALT: 257 U/L — ABNORMAL HIGH (ref 0–44)
AST: 835 U/L — ABNORMAL HIGH (ref 15–41)
Albumin: 1.5 g/dL — ABNORMAL LOW (ref 3.5–5.0)
Alkaline Phosphatase: 627 U/L — ABNORMAL HIGH (ref 38–126)
Anion gap: 14 (ref 5–15)
BUN: 58 mg/dL — ABNORMAL HIGH (ref 8–23)
CO2: 23 mmol/L (ref 22–32)
Calcium: 9.7 mg/dL (ref 8.9–10.3)
Chloride: 96 mmol/L — ABNORMAL LOW (ref 98–111)
Creatinine, Ser: 2.5 mg/dL — ABNORMAL HIGH (ref 0.44–1.00)
GFR calc Af Amer: 22 mL/min — ABNORMAL LOW (ref >60)
GFR calc non Af Amer: 19 mL/min — ABNORMAL LOW (ref >60)
Glucose, Bld: 101 mg/dL — ABNORMAL HIGH (ref 70–99)
Potassium: 5.6 mmol/L — ABNORMAL HIGH (ref 3.5–5.1)
Sodium: 133 mmol/L — ABNORMAL LOW (ref 135–145)
Total Bilirubin: 20.6 mg/dL (ref 0.3–1.2)
Total Protein: 6.6 g/dL (ref 6.5–8.1)

## 2018-10-11 LAB — CBC
HCT: 44.2 % (ref 36.0–46.0)
HCT: 41.6 % (ref 36.0–46.0)
Hemoglobin: 14.5 g/dL (ref 12.0–15.0)
Hemoglobin: 13.7 g/dL (ref 12.0–15.0)
MCH: 29.9 pg (ref 26.0–34.0)
MCH: 29.7 pg (ref 26.0–34.0)
MCHC: 32.8 g/dL (ref 30.0–36.0)
MCHC: 32.9 g/dL (ref 30.0–36.0)
MCV: 91.1 fL (ref 80.0–100.0)
MCV: 90 fL (ref 80.0–100.0)
Platelets: 138 10*3/uL — ABNORMAL LOW (ref 150–400)
Platelets: 112 10*3/uL — ABNORMAL LOW (ref 150–400)
RBC: 4.85 MIL/uL (ref 3.87–5.11)
RBC: 4.62 MIL/uL (ref 3.87–5.11)
RDW: 19.5 % — ABNORMAL HIGH (ref 11.5–15.5)
RDW: 19.1 % — ABNORMAL HIGH (ref 11.5–15.5)
WBC: 21.5 10*3/uL — ABNORMAL HIGH (ref 4.0–10.5)
WBC: 21.5 10*3/uL — ABNORMAL HIGH (ref 4.0–10.5)
nRBC: 2.9 % — ABNORMAL HIGH (ref 0.0–0.2)
nRBC: 2.8 % — ABNORMAL HIGH (ref 0.0–0.2)

## 2018-10-11 LAB — URINALYSIS, ROUTINE W REFLEX MICROSCOPIC
Glucose, UA: NEGATIVE mg/dL
Ketones, ur: NEGATIVE mg/dL
Leukocytes,Ua: NEGATIVE
Nitrite: NEGATIVE
Protein, ur: 30 mg/dL — AB
Specific Gravity, Urine: 1.029 (ref 1.005–1.030)
pH: 5 (ref 5.0–8.0)

## 2018-10-11 LAB — GLUCOSE, CAPILLARY
Glucose-Capillary: 102 mg/dL — ABNORMAL HIGH (ref 70–99)
Glucose-Capillary: 107 mg/dL — ABNORMAL HIGH (ref 70–99)
Glucose-Capillary: 67 mg/dL — ABNORMAL LOW (ref 70–99)
Glucose-Capillary: 69 mg/dL — ABNORMAL LOW (ref 70–99)
Glucose-Capillary: 79 mg/dL (ref 70–99)
Glucose-Capillary: 81 mg/dL (ref 70–99)
Glucose-Capillary: 83 mg/dL (ref 70–99)
Glucose-Capillary: 83 mg/dL (ref 70–99)
Glucose-Capillary: 95 mg/dL (ref 70–99)

## 2018-10-11 LAB — CA 125: Cancer Antigen (CA) 125: 2536 U/mL — ABNORMAL HIGH (ref 0.0–38.1)

## 2018-10-11 LAB — CANCER ANTIGEN 19-9: CA 19-9: 3696 U/mL — ABNORMAL HIGH (ref 0–35)

## 2018-10-11 LAB — CEA: CEA: 1228 ng/mL — ABNORMAL HIGH (ref 0.0–4.7)

## 2018-10-11 MED ORDER — LACTULOSE 10 GM/15ML PO SOLN
20.0000 g | Freq: Two times a day (BID) | ORAL | Status: DC
  Administered 2018-10-11 (×2): 20 g via ORAL
  Filled 2018-10-11 (×2): qty 30

## 2018-10-11 MED ORDER — SODIUM CHLORIDE 0.9 % IV SOLN
1.0000 g | INTRAVENOUS | Status: DC
  Administered 2018-10-11: 1 g via INTRAVENOUS
  Filled 2018-10-11 (×2): qty 10

## 2018-10-11 MED ORDER — LORAZEPAM 1 MG PO TABS: 1.0000 mg | ORAL_TABLET | ORAL | Status: DC | PRN

## 2018-10-11 MED ORDER — MORPHINE SULFATE (CONCENTRATE) 10 MG/0.5ML PO SOLN: 5.0000 mg | ORAL | Status: DC | PRN

## 2018-10-11 NOTE — Progress Notes (Signed)
CSW acknowledges consult for residential hospice. Following for palliative care recs. From [facility] note patient/family may prefer home with hospice vs residential.   [place], [place] [phone]

## 2018-10-11 NOTE — Progress Notes (Signed)
[facility])  [facility] room available for patient tomorrow. Completed paper work this evening with [employer]. Dr. Orpah Melter to assume care per Lillian's request.   Please send discharge summary to [phone]  RN please call report to [phone].  Thank you,  Erling Conte, [facility] Liaison [phone]  [place] are listed daily on AMION under Hospice and [place]

## 2018-10-11 NOTE — Progress Notes (Addendum)
CSW notified by Stanton Kidney with Palliative that patient will be going to [facility].   Update: [facility] able [facility] bed for patient tomorrow, family completing paperwork this evening.   [place], [place]

## 2018-10-11 NOTE — Progress Notes (Signed)
PROGRESS NOTE    Sharon Reid   XTG:[id number]  DOB: 14-Sep-1950  DOA: 10/09/2018 PCP: Nolene Ebbs, MD   Brief Narrative:  Sharon Reid is a 68 y.o. female with medical history significant of bilateral breast cancer s/p lumpectomy and radiation currently on anasrozole , HTN, Jehovah witness, and GERD; who presented for worsening weakness over the last 3 weeks and right sided abdominal pain for about 1 month now.  Noted to be jaundiced in the ED with elevated LFTs.  WBC 19.1, platelets decreased, sodium 132, potassium 5.2, chloride 92, CO2 19, BUN 44, creatinine 1.8, albumin 1.8, lipase 81, AST 1475, ALT 336, total bilirubin 18.5, ammonia 44, and INR 1.5. CT abd/pelvis> Liver has a nodular contour consistent with hepatic cirrhosis. Multiple lesions are noted throughout the liver, likely metastatic disease. 2. Sludge is noted in the gallbladder. No gallstones evident. There is gallbladder wall thickening. There is ascites which may result in gallbladder wall thickening.   Subjective: States she is tired. She thinks she is in the hospital because she is "tired and sick" but unable to tell me what is wrong with her.   Assessment & Plan:   Principal Problem:   Metastases to the liver, elevated LFTS, Jaundice - liver is enlarged and appears to have diffuse metastatic disease with peritoneal spread, lymphadenopathy and mild ascites - h/o breast CA in 2018 - declined chemo/radaition - IR (for biopsy), GI and Oncology has been consulted  - CA [id number] [id number] - CA 125 2536 CEA 1228  - a decision has been made to transition her to comfort care and there will no be a liver biopsy- palliative care has been consulted and is trying to find next of kin to speak with as the patient is clearly not able to make medical decisions for herself  Active Problems:   Acute hepatic encephalopathy - seems a little sleepy and is having trouble remembering the events of yesterday- she cannot remember having a  CT scan or ultrasound  -   started Lactulose but she still does not seem to comprehend still what is going on medically with her- will increase Lactulose - UA in ED was consistent with a UTI but after receiving a dose of Ceftriaxone in the ED, she remains confused- will recheck UA and if still + will restart antibiotics  Coagulopathy - has elevated INR likely due to above liver pathology  Stage 1a bilateral breast CA HER 2 + - diagnosed on 10/28/16 - treated with lumpectomies in 01/14/2017 - she refused chemo/radiation - have consulted Dr Lindi Adie    Hyperkalemia - has improved    Acute renal failure (ARF)  - Cr 1.80 on admission with BUN of 44 - last CR was 0.80 when last checked in 02/2017  -  Has not improved with IVF and has actually gotten worse to 2.50  and 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 stress demargination- ? UTI - blood cultures have been drawn which are negative - given Ceftriaxone and Flagyl in ED but not being continued  Thrombocytopenia   - acute and possibly due to underlying cancer    Hypoglycemia - CBG 30 on admission- has improved-     Time spent in minutes: 35 min DVT prophylaxis: SCDS Code Status: Full code Family Communication: she has no family locally- contact listed for friend, Scharlene Gloss Disposition Plan: discharge planning Consultants:   GI  IR  Oncology   Procedures:   none Antimicrobials:  Anti-infectives (From admission, onward)   Start     Dose/Rate Route Frequency Ordered Stop   10/09/18 1545  cefTRIAXone (ROCEPHIN) 2 g in sodium chloride 0.9 % 100 mL IVPB     2 g 200 mL/hr over 30 Minutes Intravenous  Once 10/09/18 1535 10/09/18 1943   10/09/18 1545  metroNIDAZOLE (FLAGYL) IVPB 500 mg     500 mg 100 mL/hr over 60 Minutes Intravenous  Once 10/09/18 1535 10/09/18 2046       Objective: Vitals:   10/10/18 1606 10/10/18 2014 10/11/18 0424 10/11/18 0755  BP: (!) 141/79 120/68 134/74 139/75  Pulse: 89 96 89  85  Resp: 20 18 18 19   Temp: 98.1 F (36.7 C) 98 F (36.7 C) (!) 97 F (36.1 C) 98.1 F (36.7 C)  TempSrc: Oral   Oral  SpO2: 97% 95% 96% 98%    Intake/Output Summary (Last 24 hours) at 10/11/2018 1159 Last data filed at 10/11/2018 1102 Gross per 24 hour  Intake 340 ml  Output 300 ml  Net 40 ml   There were no vitals filed for this visit.  Examination: General exam: Appears comfortable  HEENT: PERRLA, oral mucosa moist, no sclera icterus or thrush Respiratory system: Clear to auscultation. Respiratory effort normal. Cardiovascular system: S1 & S2 heard,  No murmurs  Gastrointestinal system: Abdomen soft,  -tender in RUQ, nondistended. Normal bowel sounds   Central nervous system: Alert - oriented to person and place only- No focal neurological deficits. Extremities: No cyanosis, clubbing or edema Skin: No rashes or ulcers Psychiatry:  not able to have a coherent discussion    Data Reviewed: I have personally reviewed following labs and imaging studies  CBC: Recent Labs  Lab 10/09/18 1339 10/11/18 0347 10/11/18 0820  WBC 19.1* 21.5* 21.5*  NEUTROABS 15.5*  --   --   HGB 14.7 14.5 13.7  HCT 45.9 44.2 41.6  MCV 93.7 91.1 90.0  PLT PLATELET CLUMPS NOTED ON SMEAR, COUNT APPEARS DECREASED 138* 633*   Basic Metabolic Panel: Recent Labs  Lab 10/09/18 1339 10/10/18 0512 10/10/18 1041 10/11/18 0820  NA 132* 132* 133* 133*  K 5.2* 4.4 4.4 5.6*  CL 92* 95* 97* 96*  CO2 19* 23 23 23   GLUCOSE 118* 97 75 101*  BUN 44* 46* 47* 58*  CREATININE 1.80* 1.85* 2.02* 2.50*  CALCIUM 9.8 9.4 9.5 9.7   GFR: CrCl cannot be calculated (Unknown ideal weight.). Liver Function Tests: Recent Labs  Lab 10/09/18 1339 10/10/18 0512 10/10/18 1041 10/11/18 0820  AST 1,475* 1,064* 1,074* 835*  ALT 336* 276* 285* 257*  ALKPHOS 691* 618* 647* 627*  BILITOT 18.5* 17.9* 19.1* 20.6*  PROT 7.2 6.4* 6.5 6.6  ALBUMIN 1.8* 1.5* 1.6* 1.5*   Recent Labs  Lab 10/09/18 1339  LIPASE 81*     Recent Labs  Lab 10/09/18 1339 10/10/18 0512  AMMONIA 44* 108*   Coagulation Profile: Recent Labs  Lab 10/09/18 1339 10/10/18 0512  INR 1.5* 1.6*   Cardiac Enzymes: Recent Labs  Lab 10/09/18 1925  CKTOTAL 297*   BNP (last 3 results) No results for input(s): PROBNP in the last 8760 hours. HbA1C: No results for input(s): HGBA1C in the last 72 hours. CBG: Recent Labs  Lab 10/11/18 0423 10/11/18 0450 10/11/18 0508 10/11/18 0723 10/11/18 1050  GLUCAP 67* 69* 79 102* 95   Lipid Profile: No results for input(s): CHOL, HDL, LDLCALC, TRIG, CHOLHDL, LDLDIRECT in the last 72 hours. Thyroid Function Tests: No results for input(s): TSH,  T4TOTAL, FREET4, T3FREE, THYROIDAB in the last 72 hours. Anemia Panel: No results for input(s): VITAMINB12, FOLATE, FERRITIN, TIBC, IRON, RETICCTPCT in the last 72 hours. Urine analysis:    Component Value Date/Time   COLORURINE BROWN (A) 10/10/2018 0553   APPEARANCEUR TURBID (A) 10/10/2018 0553   LABSPEC 1.020 10/10/2018 0553   PHURINE 5.0 10/10/2018 0553   GLUCOSEU NEGATIVE 10/10/2018 0553   HGBUR LARGE (A) 10/10/2018 0553   BILIRUBINUR MODERATE (A) 10/10/2018 0553   KETONESUR 5 (A) 10/10/2018 0553   PROTEINUR NEGATIVE 10/10/2018 0553   NITRITE NEGATIVE 10/10/2018 0553   LEUKOCYTESUR NEGATIVE 10/10/2018 0553   Sepsis Labs: @LABRCNTIP (procalcitonin:4,lacticidven:4) ) Recent Results (from the past 240 hour(s))  Blood Culture (routine x 2)     Status: None (Preliminary result)   Collection Time: 10/09/18  4:09 PM  Result Value Ref Range Status   Specimen Description BLOOD RIGHT HAND  Final   Special Requests   Final    BOTTLES DRAWN AEROBIC AND ANAEROBIC Blood Culture results may not be optimal due to an inadequate volume of blood received in culture bottles   Culture   Final    NO GROWTH 2 DAYS Performed at [facility], [place] [address], [place], [place] [address]    Report Status PENDING  Incomplete  Blood Culture  (routine x 2)     Status: None (Preliminary result)   Collection Time: 10/09/18  5:07 PM  Result Value Ref Range Status   Specimen Description BLOOD LEFT FOREARM  Final   Special Requests   Final    BOTTLES DRAWN AEROBIC AND ANAEROBIC Blood Culture results may not be optimal due to an inadequate volume of blood received in culture bottles   Culture   Final    NO GROWTH 2 DAYS Performed at [facility], [place] [address], [place], [place] [address]    Report Status PENDING  Incomplete         Radiology Studies: Ct Abdomen Pelvis Wo Contrast  Result Date: 10/09/2018 CLINICAL DATA:  History of breast carcinoma. Dizziness, weakness, jaundice and decreased urine output. EXAM: CT ABDOMEN AND PELVIS WITHOUT CONTRAST TECHNIQUE: Multidetector CT imaging of the abdomen and pelvis was performed following the standard protocol without IV contrast. COMPARISON:  CT of the chest, abdomen and pelvis with contrast on 02/23/2015 FINDINGS: Lower chest: There is atelectasis at the right lung base with a small right pleural effusion. Hepatobiliary: The liver shows significant enlargement and very abnormal nodular parenchymal appearance which is new since 2016. This is likely consistent with involvement by diffuse metastatic disease. There is no evidence of intrahepatic biliary ductal dilatation. Pancreas: Mild edema around the body of the pancreas may relate to the presence of some free fluid around the liver. However, a component of pancreatitis cannot be entirely excluded. Spleen: Normal in size without focal abnormality. Adrenals/Urinary Tract: Adrenal glands are unremarkable. Kidneys are normal, without renal calculi, focal lesion, or hydronephrosis. Bladder is unremarkable. Stomach/Bowel: Bowel shows no evidence of obstruction or ileus. No free air. The appendix is normal. Vascular/Lymphatic: At least one prominent periportal/peripancreatic lymph node identifiable measuring approximately 1.7 cm in short axis.  Multiple small nodules in the region of the gastrohepatic ligament and in the left upper quadrant may represent small gastrohepatic lymph nodes versus metastatic deposits. The largest measures approximately 10 mm. Reproductive: Mild uterine enlargement with probable underlying uterine fibroids. Other: Small amount of ascites is present predominantly adjacent to the liver but also in the pelvis. New subtle nodularity  in the peritoneal fat anterior to the lower liver and extending in the right pericolic gutter is suspicious for peritoneal spread of tumor. There are also some small mesenteric nodules suspicious for peritoneal tumor. Musculoskeletal: Subtle small lytic areas in the left iliac bone and new scalloped contour along the posterior margin of the superior endplate of L4 may be consistent with subtle metastatic lesions. The L4 abnormality could also represent a Schmorl's node. IMPRESSION: 1. New significant hepatomegaly with diffusely abnormal nodular appearance of the liver parenchyma likely consistent with involvement by diffuse metastatic disease. No evidence of biliary obstruction. 2. Mild edema around the body of the pancreas may relate to pancreatitis. 3. At least one enlarged periportal/peripancreatic lymph node as well as other findings suspicious for lymph node metastases and peritoneal carcinomatosis. 4. Small amount of ascites, predominantly around the liver. 5. Right basilar atelectasis with small right pleural effusion. Electronically Signed   By: Aletta Edouard M.D.   On: 10/09/2018 15:40   Dg Chest Portable 1 View  Result Date: 10/09/2018 CLINICAL DATA:  68 year old female with altered mental status EXAM: PORTABLE CHEST 1 VIEW COMPARISON:  02/24/2015, 02/23/2015 FINDINGS: Cardiomediastinal silhouette unchanged in size and contour. Similar appearance of asymmetric elevation the right hemidiaphragm with blunting of the right costophrenic angle. No pneumothorax. Coarsened interstitial markings  similar to the prior. Surgical changes of the bilateral chest wall. IMPRESSION: Low lung volumes with right basilar opacity, potentially atelectasis/consolidation or small pleural fluid. Surgical changes of the bilateral chest wall Electronically Signed   By: Corrie Mckusick D.O.   On: 10/09/2018 12:47   US Abdomen Limited Ruq  Result Date: 10/09/2018 CLINICAL DATA:  Breast carcinoma. Upper abdominal pain with jaundice EXAM: ULTRASOUND ABDOMEN LIMITED RIGHT UPPER QUADRANT COMPARISON:  CT abdomen and pelvis October 09, 2018 FINDINGS: Gallbladder: There is sludge in the gallbladder. No gallstones are evident. There is diffuse gallbladder wall thickening. There is no pericholecystic fluid. No sonographic Murphy sign noted by sonographer. Common bile duct: Diameter: 4 mm. No intrahepatic or extrahepatic biliary duct dilatation. Liver: Multiple lesions are noted throughout the liver, likely widespread metastatic disease. Liver has a nodular contour consistent with a degree of underlying hepatic cirrhosis. Portal vein is patent on color Doppler imaging with normal direction of blood flow towards the liver. There is mild ascites. There is a right pleural effusion. IMPRESSION: 1. Liver has a nodular contour consistent with hepatic cirrhosis. Multiple lesions are noted throughout the liver, likely metastatic disease. 2. Sludge is noted in the gallbladder. No gallstones evident. There is gallbladder wall thickening. There is ascites which may result in gallbladder wall thickening. A degree of acalculus cholecystitis, however, cannot be excluded in this circumstance. 3.  Right pleural effusion.  Mild ascites noted. Electronically Signed   By: Lowella Grip III M.D.   On: 10/09/2018 17:35      Scheduled Meds:  sodium chloride flush  3 mL Intravenous Q12H   Continuous Infusions:   LOS: 2 days      Debbe Odea, MD [facility] Pager: [email or web address] Password [email or web address] 10/11/2018, 11:59 AM

## 2018-10-11 NOTE — Consult Note (Addendum)
Consultation Note Date: 10/11/2018   Patient Name: Sharon Reid  DOB: 1950-11-29  MRN: [id number]  Age / Sex: 68 y.o., female  PCP: Nolene Ebbs, MD Referring Physician: Debbe Odea, MD  Reason for Consultation: Establishing goals of care and Psychosocial/spiritual support  HPI/Patient Profile: 68 y.o. female   admitted on 10/09/2018 past  medical history significant of bilateral breast cancer s/p lumpectomy and radiation , HTN, Jehovah witness, and GERD; who presented for worsening weakness over the last 3 weeks.  According to oncology note patient has diffuse metastatic disease in her liver with peritoneal spread, lymphadenopathy and mild ascites.  She has significantly elevated LFTs and total bilirubin.  Patient has expressed in the past that she does not want treatment for cancer.  Currently with hepatic encephalopathy.  Patient had reportedly gone to a funeral last month in [place] of her niece who died of COVID-19, reported using facemask along with practice social distancing.    Patient faces treatment option decisions, advanced directive decisions and anticipatory care needs.   Clinical Assessment and Goals of Care:  This NP Wadie Lessen reviewed medical records, received report from team, assessed the patient and then meet at the patient's bedside and spoke by phone with her life long friend and documented  Jehovah Witness/card contact to discuss diagnosis, prognosis, GOC, EOL wishes disposition and options.  Concept of  Palliative Care were discussed  A  discussion was had today regarding advanced directives.  Concepts specific to code status, artifical feeding and hydration, continued IV antibiotics and rehospitalization was had.  The difference between a aggressive medical intervention path  and a palliative comfort care path for this patient at this time was had.  Values and goals of care  important to patient and family were attempted to be elicited.  Ms. Randel Books was able to confidently verbalize her understanding of Ms. Rex Kregel's wishes.  She tells me that patient would not want life prolonging measures at this time and would hope for comfort and dignity.   Natural trajectory and expectations at EOL were discussed.  Questions and concerns addressed.   Family encouraged to call with questions or concerns.    PMT will continue to support holistically.   OTHER/lifelong friend and religious support person / Hydrographic surveyor Patient has a documented healthcare power of attorney through her Jehovah witness support.  There was a card in her wallet which I copied and put a hard copy in the chart and returned her original to her wallet.  CNA/ Shanel at bedside as witness    SUMMARY OF RECOMMENDATIONS    Code Status/Advance Care Planning:  DNR-documented today    Symptom Management:   Pain/dyspnea: Roxanol 5 mg p.o./sublingual every 1 hour as needed  Agitation: Ativan 1 mg p.o./sublingual as needed  Palliative Prophylaxis:   Aspiration, Bowel Regimen, Delirium Protocol, Frequent Pain Assessment and Oral Care  Additional Recommendations (Limitations, Scope, Preferences):  Full Comfort Care   DNR/DNI  No desire for treatment for her cancer diagnosis  No further life prolonging  measures; IV medication, artificial feeding or hydration, diagnostics  Symptom management to enhance comfort  Psycho-social/Spiritual:   Desire for further Chaplaincy support:no  Additional Recommendations: Education on Hospice  Prognosis:   < 2 weeks  Discharge Planning: Hospice facility      Primary Diagnoses: Present on Admission: . Breast cancer of upper-outer quadrant of left female breast ([place]) . Hyperkalemia . Metastases to the liver ([place]) . Hypoglycemia . Hepatic cirrhosis ([place]) . Acute renal failure (ARF) ([place]) . Dehydration . Leukocytosis .  Hyperbilirubinemia   I have reviewed the medical record, interviewed the patient and family, and examined the patient. The following aspects are pertinent.  Past Medical History:  Diagnosis Date  . Breast cancer ([place]) 10/31/2016   patient was notifed today!!  . GERD (gastroesophageal reflux disease)   . History of radiation therapy 03/29/17-05/17/17   left breast 4 field 50.4 Gy in 28 fractions, left axilla 45 Gy in 25 fx, right breast 4 field 50.4 Gy in 28 fractions, right axilla 45 Gy in 25 fx, left breast boost 12 Gy in 6 fx, right breast boost 10 Gy in 5 fractions  . Hypertension   . Refusal of blood transfusions as patient is Jehovah's Witness   . Rib fracture 2016   came from a fall   Social History   Socioeconomic History  . Marital status: Divorced    Spouse name: Not on file  . Number of children: Not on file  . Years of education: Not on file  . Highest education level: Not on file  Occupational History  . Occupation: retired  Scientific laboratory technician  . Financial resource strain: Not on file  . Food insecurity:    Worry: Not on file    Inability: Not on file  . Transportation needs:    Medical: Not on file    Non-medical: Not on file  Tobacco Use  . Smoking status: Never Smoker  . Smokeless tobacco: Never Used  Substance and Sexual Activity  . Alcohol use: Yes    Comment: beer occ  . Drug use: No  . Sexual activity: Not on file  Lifestyle  . Physical activity:    Days per week: Not on file    Minutes per session: Not on file  . Stress: Not on file  Relationships  . Social connections:    Talks on phone: Not on file    Gets together: Not on file    Attends religious service: Not on file    Active member of club or organization: Not on file    Attends meetings of clubs or organizations: Not on file    Relationship status: Not on file  Other Topics Concern  . Not on file  Social History Narrative  . Not on file   Family History  Problem Relation Age of Onset   . Breast cancer Neg Hx   . Colon cancer Neg Hx    Scheduled Meds: . sodium chloride flush  3 mL Intravenous Q12H   Continuous Infusions: PRN Meds:.albuterol, LORazepam, morphine CONCENTRATE, ondansetron **OR** ondansetron (ZOFRAN) IV, oxyCODONE Medications Prior to Admission:  Prior to Admission medications   Medication Sig Start Date End Date Taking? Authorizing Provider  anastrozole (ARIMIDEX) 1 MG tablet Take 1 tablet (1 mg total) by mouth daily. Patient not taking: Reported on 10/10/2018 05/11/17   Nicholas Lose, MD   Allergies  Allergen Reactions  . Other Other (See Comments)    Perfume gives headache   Review of Systems  Unable  to perform ROS: Acuity of condition    Physical Exam Constitutional:      Appearance: She is normal weight. She is ill-appearing.  Cardiovascular:     Rate and Rhythm: Normal rate and regular rhythm.     Heart sounds: Normal heart sounds.  Pulmonary:     Effort: Pulmonary effort is normal.     Breath sounds: Normal breath sounds.  Skin:    General: Skin is warm and dry.  Neurological:     Mental Status: She is lethargic.     Vital Signs: BP 139/75 (BP Location: Right Arm)   Pulse 85   Temp 98.1 F (36.7 C) (Oral)   Resp 19   SpO2 98%  Pain Scale: 0-10   Pain Score: Asleep   SpO2: SpO2: 98 % O2 Device:SpO2: 98 % O2 Flow Rate: .O2 Flow Rate (L/min): 2 L/min  IO: Intake/output summary:   Intake/Output Summary (Last 24 hours) at 10/11/2018 1029 Last data filed at 10/11/2018 0845 Gross per 24 hour  Intake 617 ml  Output 250 ml  Net 367 ml    LBM: Last BM Date: 10/08/18 Baseline Weight:   Most recent weight:       Palliative Assessment/Data: 30%   Discussed with Altamese Dilling NP and Dr Wynelle Cleveland  Time In: 0915 Time Out: 1030 Time Total: 75 minutes Greater than 50%  of this time was spent counseling and coordinating care related to the above assessment and plan.  Signed by: Wadie Lessen, NP   Please contact Palliative  Medicine Team phone at [phone] for questions and concerns.  For individual provider: See Shea Evans

## 2018-10-11 NOTE — Progress Notes (Signed)
Reviewed Oncology note.  Infiltrative metastatic liver disease with liver failure. Oncology recommended hospice palliative care and to avoid further imaging or biopsy.  Will cancel request for liver biopsy by IR Available if have questions or concerns, signing off.  K. Denzil Magnuson , MD

## 2018-10-11 NOTE — Progress Notes (Addendum)
Patient was found on the floor laying on her right side. She mentioned she was going to the bathroom and was with incontinent stool. She was alert with confusion. No visible injuries noted and vital signs was stable. Patient was put back to bed with 5 staff. Patient denies of any pain in other parts of her body except the soreness of her right upper quadrant pain which was her chief complaint upon admission. Md was notified via [place] and family friend made aware of the fall. Bed alarm on and call light within reach. Patient educated to call for assistance and patient verbalized understanding.

## 2018-10-11 NOTE — Progress Notes (Signed)
Patient has now been seen by the oncology team, Dr. Lindi Adie. He is certain this is metastatic carcinoma, likely from her breast cancer.  Had discussion with the pt, pt's friend/POA.  She had previously refused chemo and refuses any chemo or further treatment at this point. He feels that given the patient's lack of interest in treatment, that it is unnecessary to pursue liver biopsy so this is been canceled. Dr Lindi Adie says he will ask Pall Care to see pt for goals of care meeting to establish with hospice service.  The transjugular liver biopsy is canceled, I notified IR.  GI signing off.  Azucena Freed PA-C.

## 2018-10-11 NOTE — Progress Notes (Addendum)
HEMATOLOGY-ONCOLOGY PROGRESS NOTE  SUBJECTIVE: Patient seen this morning. Repeatedly states "I'm so tired." Could not really answer questions for me. Has been seen by palliative care this am.      Malignant neoplasm of upper-outer quadrant of right breast in female, estrogen receptor positive ([place])   10/28/2016 Initial Diagnosis    Screening detected right breast distortion at UOQ 10:00: 2.4 cm with abnormal lymph node in the axilla, biopsy invasive lobular cancer with LCIS ER 90%, PR 80%, Ki-67 10%, HER-2 negative ratio 1.48, lymph node biopsy also positive T2 N0 stage II a (New AJCC)    10/28/2016 Miscellaneous    Mammaprint low-risk luminal type A    11/28/2016 Breast MRI    malignancy in the retroareolar upper outer quadrant of the right breast 1.7 x 1.0 x 1.5 cm single right axillary lymph node, Suspicious clumped linear enhancement in the upper-outer quadrant of the LEFT breast    01/24/2017 Surgery    Bilateral lumpectomies: Left: DCIS grade 3, 3.6 cm, 4.8 cm, with microinvasion, margins negative, 1/2 lymph nodes positive ER 0%, PR 0% Her 2 Positive; right: ILC grade 2, 1.8 cm, margin of resection focally positive anterior cauterized margin, 2/3 lymph nodes positive, ER 90%, PR 80%, HER-2 negative, Ki-67 10%     Chemotherapy    Patient refused chemotherapy for the HER-2 positive breast cancer     03/29/2017 - 05/11/2017 Radiation Therapy    Adjuvant radiation therapy    06/2017 -  Anti-estrogen oral therapy    Letrozole daily     REVIEW OF SYSTEMS:   Full ROS could not be obtained due to patient condition. States she is tired.   I have reviewed the past medical history, past surgical history, social history and family history with the patient and they are unchanged from previous note.   PHYSICAL EXAMINATION: ECOG PERFORMANCE STATUS: 3 - Symptomatic, >50% confined to bed  Vitals:   10/11/18 0424 10/11/18 0755  BP: 134/74 139/75  Pulse: 89 85  Resp: 18 19  Temp: (!) 97 F  (36.1 C) 98.1 F (36.7 C)  SpO2: 96% 98%   GENERAL:alert, restless at times SKIN: Dry skin, no rashes or significant lesions EYES: Icteric OROPHARYNX:no exudate, no erythema and lips, buccal mucosa, and tongue normal  NECK: supple, thyroid normal size, non-tender, without nodularity LYMPH:  no palpable lymphadenopathy in the cervical, axillary or inguinal LUNGS: clear to auscultation and percussion with normal breathing effort HEART: regular rate & rhythm and no murmurs and no lower extremity edema ABDOMEN:abdomen soft, non-tender and normal bowel sounds Musculoskeletal:no cyanosis of digits and no clubbing  NEURO: Intermittent confusion, no focal motor/sensory deficits  LABORATORY DATA:  I have reviewed the data as listed CMP Latest Ref Rng & Units 10/11/2018 10/10/2018 10/10/2018  Glucose 70 - 99 mg/dL 101(H) 75 97  BUN 8 - 23 mg/dL 58(H) 47(H) 46(H)  Creatinine 0.44 - 1.00 mg/dL 2.50(H) 2.02(H) 1.85(H)  Sodium 135 - 145 mmol/L 133(L) 133(L) 132(L)  Potassium 3.5 - 5.1 mmol/L 5.6(H) 4.4 4.4  Chloride 98 - 111 mmol/L 96(L) 97(L) 95(L)  CO2 22 - 32 mmol/L '23 23 23  ' Calcium 8.9 - 10.3 mg/dL 9.7 9.5 9.4  Total Protein 6.5 - 8.1 g/dL 6.6 6.5 6.4(L)  Total Bilirubin 0.3 - 1.2 mg/dL 20.6(HH) 19.1(HH) 17.9(H)  Alkaline Phos 38 - 126 U/L 627(H) 647(H) 618(H)  AST 15 - 41 U/L 835(H) 1,074(H) 1,064(H)  ALT 0 - 44 U/L 257(H) 285(H) 276(H)    Lab Results  Component Value Date   WBC 21.5 (H) 10/11/2018   HGB 13.7 10/11/2018   HCT 41.6 10/11/2018   MCV 90.0 10/11/2018   PLT 112 (L) 10/11/2018   NEUTROABS 15.5 (H) 10/09/2018    ASSESSMENT AND PLAN: 1.  Metastases to the liver, elevated LFTs, and jaundice -The patient appears to have diffuse metastatic disease in her liver with peritoneal spread, lymphadenopathy and mild ascites. This is likely metastatic breast cancer.  -She has significantly elevated LFTs and total bilirubin -Patient has expressed that she does not want any treatment  for cancer. -Palliative care has seen her and has talked with POA. Plan is to transition to full comfort measures and Hospice.   2.  Acute hepatic encephalopathy -Hepatitis panel negative -Likely due to her liver dysfunction.  3.  Coagulopathy -INR elevated likely due to liver pathology  4.  Leukocytosis -She remains afebrile -Blood cultures are negative to date  5.  Acute renal failure -Creatinine elevated on admission -patient is transitioning to comfort measures.  Mikey Bussing, DNP, AGPCNP-BC, AOCNP  Attending Note  I personally saw the patient, reviewed the chart and examined the patient. The plan of care was discussed with the patients POA. I agree with the assessment and plan as documented above.  I agree with the current treatment plan for hospice care.  Patients cancer has progressed tremendously making it impossible to come up with a non-chemotherapy treatment plan.  Based on her wishes not to receive chemo, we agree with canceling the biopsy and pursuing hospice. We will sign off. Thank you much for involving [place] in her care.

## 2018-10-12 DIAGNOSIS — R160 Hepatomegaly, not elsewhere classified: Secondary | ICD-10-CM

## 2018-10-12 LAB — AFP TUMOR MARKER: AFP, Serum, Tumor Marker: 10.2 ng/mL — ABNORMAL HIGH (ref 0.0–8.3)

## 2018-10-12 LAB — GLUCOSE, CAPILLARY
Glucose-Capillary: 124 mg/dL — ABNORMAL HIGH (ref 70–99)
Glucose-Capillary: 57 mg/dL — ABNORMAL LOW (ref 70–99)
Glucose-Capillary: 60 mg/dL — ABNORMAL LOW (ref 70–99)
Glucose-Capillary: 67 mg/dL — ABNORMAL LOW (ref 70–99)
Glucose-Capillary: 75 mg/dL (ref 70–99)
Glucose-Capillary: 98 mg/dL (ref 70–99)

## 2018-10-12 MED ORDER — LACTULOSE 10 GM/15ML PO SOLN: 20.0000 g | Freq: Two times a day (BID) | ORAL | 0 refills | Status: AC

## 2018-10-12 MED ORDER — DEXTROSE 50 % IV SOLN
INTRAVENOUS | Status: AC
  Administered 2018-10-12: 12.5 g via INTRAVENOUS
  Filled 2018-10-12: qty 50

## 2018-10-12 MED ORDER — DEXTROSE 50 % IV SOLN
12.5000 g | INTRAVENOUS | Status: AC
  Administered 2018-10-12: 12.5 g via INTRAVENOUS

## 2018-10-12 NOTE — Discharge Summary (Signed)
Physician Discharge Summary  Sharon Reid IOE:[id number] DOB: [birth year] DOA: 10/09/2018  PCP: Nolene Ebbs, MD  Admit date: 10/09/2018 Discharge date: 10/12/2018  Admitted From: home Disposition:  Hospice home  Discharge Condition:  stable   CODE STATUS:  DNR Consultations:  GI  IR  Oncology    Palliative care   Discharge Diagnoses:  Principal Problem:   Metastases to the liver [facility]) Active Problems:   Breast cancer of upper-outer quadrant of left female breast ([place])   Hyperkalemia   Hypoglycemia   Hepatic cirrhosis ([place])   Acute renal failure (ARF) ([facility])   Dehydration   Leukocytosis   Hyperbilirubinemia   Acute hepatic encephalopathy   Elevated LFTs   Liver mass   Palliative care by specialist   DNR (do not resuscitate)   Cancer associated pain     Brief Summary: Sharon Reid is a 68 y.o.femalewith medical history significant ofbilateral breast cancers/plumpectomy andradiation currently on anasrozole,HTN, Jehovah witness, and GERD; who presented for worsening weaknessover the last 3 weeks and right sided abdominal pain for about 1 month now.  Noted to be jaundiced in the ED with elevated LFTs.  WBC 19.1, platelets decreased, sodium 132, potassium 5.2, chloride 92, CO2 19, BUN 44, creatinine 1.8, albumin 1.8, lipase 81, AST 1475, ALT 336,total bilirubin 18.5,ammonia 44, and INR 1.5. CT abd/pelvis> Liver has a nodular contour consistent with hepatic cirrhosis. Multiple lesions are noted throughout the liver, likely metastatic disease. 2. Sludge is noted in the gallbladder. No gallstones evident. There is gallbladder wall thickening. There is ascites which may result in gallbladder wall thickening.   Hospital Course:  Principal Problem:   Metastases to the liver, elevated LFTS, Jaundice - liver is enlarged and appears to have diffuse metastatic disease with peritoneal spread, lymphadenopathy and mild ascites - h/o breast CA in 2018 - declined  chemo/radaition - IR (for biopsy), GI and Oncology has been consulted  - CA [id number] - CA 125 2536 - CEA 1228  - after eval by oncology and considering the fact that she declined treatment for her breast cancer, palliative care was suggested by oncology. Palliative care has spoken with her POA and a decision has been made to transition her to comfort care and therefore the liver biopsy has been cancelled - she is receiving Oxycodone PRN for her RUQ pain - she will be going to a hospice home today  Active Problems:   Acute hepatic encephalopathy - very confused about recent events- not sure why she is in the hospital - Ammonia noted to be elevated ~ 100 and thus started Lactulose but she still does not seem to comprehend still what is going on medically with her-   - UA in ED was consistent with a UTI but after receiving Ceftriaxone, she remains confused- ? 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 stress demargination- ?  UTI - blood cultures have been drawn which are negative - given Ceftriaxone for a UTI but WBC not improved  Thrombocytopenia   - acute and possibly due to underlying cancer    Hypoglycemia - CBG 30 on admission- has improved       Discharge Exam: Vitals:   10/11/18 2009 10/12/18 0503  BP: (!) 113/57 137/80  Pulse: 96 94  Resp: 18 18  Temp: 97.6 F (36.4 C) 98 F (36.7 C)  SpO2: 93% 93%   Vitals:   10/11/18 0755  10/11/18 1815 10/11/18 2009 10/12/18 0503  BP: 139/75 (!) 144/71 (!) 113/57 137/80  Pulse: 85  96 94  Resp: 19 20 18 18   Temp: 98.1 F (36.7 C) 98.4 F (36.9 C) 97.6 F (36.4 C) 98 F (36.7 C)  TempSrc: Oral Oral Oral   SpO2: 98%  93% 93%    General: Pt is alert, awake but confused, not in acute distress Cardiovascular: RRR, S1/S2 +, no rubs, no gallops Respiratory: CTA bilaterally, no wheezing, no rhonchi Abdominal: Soft, NT, ND, bowel sounds + Extremities: no edema, no cyanosis   Discharge Instructions  Discharge Instructions    Increase activity slowly   Complete by:  As directed      Allergies as of 10/12/2018      Reactions   Other Other (See Comments)   Perfume gives headache      Medication List    STOP taking these medications   anastrozole 1 MG tablet Commonly known as:  ARIMIDEX     TAKE these medications   lactulose 10 GM/15ML solution Commonly known as:  CHRONULAC Take 30 mLs (20 g total) by mouth 2 (two) times daily.       Allergies  Allergen Reactions  . Other Other (See Comments)    Perfume gives headache     Procedures/Studies:    Ct Abdomen Pelvis Wo Contrast  Result Date: 10/09/2018 CLINICAL DATA:  History of breast carcinoma. Dizziness, weakness, jaundice and decreased urine output. EXAM: CT ABDOMEN AND PELVIS WITHOUT CONTRAST TECHNIQUE: Multidetector CT imaging of the abdomen and pelvis was performed following the standard protocol without IV contrast. COMPARISON:  CT of the chest, abdomen and pelvis with contrast on 02/23/2015 FINDINGS: Lower chest: There is atelectasis at the right lung base with a small right pleural effusion. Hepatobiliary: The liver shows significant enlargement and very abnormal nodular parenchymal appearance which is new since 2016. This is likely consistent with involvement by diffuse metastatic disease. There is no evidence of intrahepatic biliary ductal dilatation. Pancreas: Mild edema around the body of the  pancreas may relate to the presence of some free fluid around the liver. However, a component of pancreatitis cannot be entirely excluded. Spleen: Normal in size without focal abnormality. Adrenals/Urinary Tract: Adrenal glands are unremarkable. Kidneys are normal, without renal calculi, focal lesion, or hydronephrosis. Bladder is unremarkable. Stomach/Bowel: Bowel shows no evidence of obstruction or ileus. No free air. The appendix is normal. Vascular/Lymphatic: At least one prominent periportal/peripancreatic lymph node identifiable measuring approximately 1.7 cm in short axis. Multiple small nodules in the region of the gastrohepatic ligament and in the left upper quadrant may represent small gastrohepatic lymph nodes versus metastatic deposits. The largest measures approximately 10 mm. Reproductive: Mild uterine enlargement with probable underlying uterine fibroids. Other: Small amount of ascites is present predominantly adjacent to the liver but also in the pelvis. New subtle nodularity in the peritoneal fat anterior to the lower liver and extending in the right pericolic gutter  is suspicious for peritoneal spread of tumor. There are also some small mesenteric nodules suspicious for peritoneal tumor. Musculoskeletal: Subtle small lytic areas in the left iliac bone and new scalloped contour along the posterior margin of the superior endplate of L4 may be consistent with subtle metastatic lesions. The L4 abnormality could also represent a Schmorl's node. IMPRESSION: 1. New significant hepatomegaly with diffusely abnormal nodular appearance of the liver parenchyma likely consistent with involvement by diffuse metastatic disease. No evidence of biliary obstruction. 2. Mild edema around the body of the pancreas may relate to pancreatitis. 3. At least one enlarged periportal/peripancreatic lymph node as well as other findings suspicious for lymph node metastases and peritoneal carcinomatosis. 4. Small amount of  ascites, predominantly around the liver. 5. Right basilar atelectasis with small right pleural effusion. Electronically Signed   By: Aletta Edouard M.D.   On: 10/09/2018 15:40   Dg Chest Portable 1 View  Result Date: 10/09/2018 CLINICAL DATA:  68 year old female with altered mental status EXAM: PORTABLE CHEST 1 VIEW COMPARISON:  02/24/2015, 02/23/2015 FINDINGS: Cardiomediastinal silhouette unchanged in size and contour. Similar appearance of asymmetric elevation the right hemidiaphragm with blunting of the right costophrenic angle. No pneumothorax. Coarsened interstitial markings similar to the prior. Surgical changes of the bilateral chest wall. IMPRESSION: Low lung volumes with right basilar opacity, potentially atelectasis/consolidation or small pleural fluid. Surgical changes of the bilateral chest wall Electronically Signed   By: Corrie Mckusick D.O.   On: 10/09/2018 12:47   US Abdomen Limited Ruq  Result Date: 10/09/2018 CLINICAL DATA:  Breast carcinoma. Upper abdominal pain with jaundice EXAM: ULTRASOUND ABDOMEN LIMITED RIGHT UPPER QUADRANT COMPARISON:  CT abdomen and pelvis October 09, 2018 FINDINGS: Gallbladder: There is sludge in the gallbladder. No gallstones are evident. There is diffuse gallbladder wall thickening. There is no pericholecystic fluid. No sonographic Murphy sign noted by sonographer. Common bile duct: Diameter: 4 mm. No intrahepatic or extrahepatic biliary duct dilatation. Liver: Multiple lesions are noted throughout the liver, likely widespread metastatic disease. Liver has a nodular contour consistent with a degree of underlying hepatic cirrhosis. Portal vein is patent on color Doppler imaging with normal direction of blood flow towards the liver. There is mild ascites. There is a right pleural effusion. IMPRESSION: 1. Liver has a nodular contour consistent with hepatic cirrhosis. Multiple lesions are noted throughout the liver, likely metastatic disease. 2. Sludge is noted in the  gallbladder. No gallstones evident. There is gallbladder wall thickening. There is ascites which may result in gallbladder wall thickening. A degree of acalculus cholecystitis, however, cannot be excluded in this circumstance. 3.  Right pleural effusion.  Mild ascites noted. Electronically Signed   By: Lowella Grip III M.D.   On: 10/09/2018 17:35     The results of significant diagnostics from this hospitalization (including imaging, microbiology, ancillary and laboratory) are listed below for reference.     Microbiology: Recent Results (from the past 240 hour(s))  Blood Culture (routine x 2)     Status: None (Preliminary result)   Collection Time: 10/09/18  4:09 PM  Result Value Ref Range Status   Specimen Description BLOOD RIGHT HAND  Final   Special Requests   Final    BOTTLES DRAWN AEROBIC AND ANAEROBIC Blood Culture results may not be optimal due to an inadequate volume of blood received in culture bottles   Culture   Final    NO GROWTH 2 DAYS Performed at [facility], [place] [address] [place], [place] [address]  Report Status PENDING  Incomplete  Blood Culture (routine x 2)     Status: None (Preliminary result)   Collection Time: 10/09/18  5:07 PM  Result Value Ref Range Status   Specimen Description BLOOD LEFT FOREARM  Final   Special Requests   Final    BOTTLES DRAWN AEROBIC AND ANAEROBIC Blood Culture results may not be optimal due to an inadequate volume of blood received in culture bottles   Culture   Final    NO GROWTH 2 DAYS Performed at [facility], [place] [address], [place], [place] [address]    Report Status PENDING  Incomplete     Labs: BNP (last 3 results) No results for input(s): BNP in the last 8760 hours. Basic Metabolic Panel: Recent Labs  Lab 10/09/18 1339 10/10/18 0512 10/10/18 1041 10/11/18 0820  NA 132* 132* 133* 133*  K 5.2* 4.4 4.4 5.6*  CL 92* 95* 97* 96*  CO2 19* 23 23 23   GLUCOSE 118* 97 75 101*  BUN 44* 46* 47* 58*   CREATININE 1.80* 1.85* 2.02* 2.50*  CALCIUM 9.8 9.4 9.5 9.7   Liver Function Tests: Recent Labs  Lab 10/09/18 1339 10/10/18 0512 10/10/18 1041 10/11/18 0820  AST 1,475* 1,064* 1,074* 835*  ALT 336* 276* 285* 257*  ALKPHOS 691* 618* 647* 627*  BILITOT 18.5* 17.9* 19.1* 20.6*  PROT 7.2 6.4* 6.5 6.6  ALBUMIN 1.8* 1.5* 1.6* 1.5*   Recent Labs  Lab 10/09/18 1339  LIPASE 81*   Recent Labs  Lab 10/09/18 1339 10/10/18 0512  AMMONIA 44* 108*   CBC: Recent Labs  Lab 10/09/18 1339 10/11/18 0347 10/11/18 0820  WBC 19.1* 21.5* 21.5*  NEUTROABS 15.5*  --   --   HGB 14.7 14.5 13.7  HCT 45.9 44.2 41.6  MCV 93.7 91.1 90.0  PLT PLATELET CLUMPS NOTED ON SMEAR, COUNT APPEARS DECREASED 138* 112*   Cardiac Enzymes: Recent Labs  Lab 10/09/18 1925  CKTOTAL 297*   BNP: Invalid input(s): POCBNP CBG: Recent Labs  Lab 10/12/18 0401 10/12/18 0429 10/12/18 0811 10/12/18 0843 10/12/18 0858  GLUCAP 57* 124* 60* 67* 75   D-Dimer No results for input(s): DDIMER in the last 72 hours. Hgb A1c No results for input(s): HGBA1C in the last 72 hours. Lipid Profile No results for input(s): CHOL, HDL, LDLCALC, TRIG, CHOLHDL, LDLDIRECT in the last 72 hours. Thyroid function studies No results for input(s): TSH, T4TOTAL, T3FREE, THYROIDAB in the last 72 hours.  Invalid input(s): FREET3 Anemia work up No results for input(s): VITAMINB12, FOLATE, FERRITIN, TIBC, IRON, RETICCTPCT in the last 72 hours. Urinalysis    Component Value Date/Time   COLORURINE AMBER (A) 10/11/2018 1235   APPEARANCEUR TURBID (A) 10/11/2018 1235   LABSPEC 1.029 10/11/2018 1235   PHURINE 5.0 10/11/2018 1235   GLUCOSEU NEGATIVE 10/11/2018 1235   HGBUR LARGE (A) 10/11/2018 1235   BILIRUBINUR MODERATE (A) 10/11/2018 1235   KETONESUR NEGATIVE 10/11/2018 1235   PROTEINUR 30 (A) 10/11/2018 1235   NITRITE NEGATIVE 10/11/2018 1235   LEUKOCYTESUR NEGATIVE 10/11/2018 1235   Sepsis Labs Invalid input(s):  PROCALCITONIN,  WBC,  LACTICIDVEN Microbiology Recent Results (from the past 240 hour(s))  Blood Culture (routine x 2)     Status: None (Preliminary result)   Collection Time: 10/09/18  4:09 PM  Result Value Ref Range Status   Specimen Description BLOOD RIGHT HAND  Final   Special Requests   Final    BOTTLES DRAWN AEROBIC AND ANAEROBIC Blood Culture results may not  be optimal due to an inadequate volume of blood received in culture bottles   Culture   Final    NO GROWTH 2 DAYS Performed at [facility], [place] [address], [place], [place] [address]    Report Status PENDING  Incomplete  Blood Culture (routine x 2)     Status: None (Preliminary result)   Collection Time: 10/09/18  5:07 PM  Result Value Ref Range Status   Specimen Description BLOOD LEFT FOREARM  Final   Special Requests   Final    BOTTLES DRAWN AEROBIC AND ANAEROBIC Blood Culture results may not be optimal due to an inadequate volume of blood received in culture bottles   Culture   Final    NO GROWTH 2 DAYS Performed at [facility], [place] [address], [place], [place] [address]    Report Status PENDING  Incomplete     Time coordinating discharge in minutes: 65  SIGNED:   Debbe Odea, MD  [facility] 10/12/2018, 9:17 AM Pager   If 7PM-7AM, please contact night-coverage [email or web address] Password [email or web address]

## 2018-10-12 NOTE — Progress Notes (Signed)
DISCHARGE NOTE HOSPICE ARADHANA GIN to be discharged [facility] per MD order. Patient verbalized understanding.  Skin clean, dry and intact without evidence of skin break down, no evidence of skin tears noted. IV catheter discontinued intact. Site without signs and symptoms of complications. Dressing and pressure applied. Pt denies pain at the site currently. No complaints noted.  Patient free of lines, drains, and wounds.   Discharge packet assembled. An After Visit Summary (AVS) was printed and given to the EMS personnel. Patient escorted via stretcher and discharged to designated hospice facility via ambulance. Report called to accepting facility; all questions and concerns addressed.   Stephan Minister, RN

## 2018-10-12 NOTE — Progress Notes (Signed)
Hypoglycemic Event  CBG: Results for Sharon, Reid (MRN [id number]) as of 10/12/2018 04:10  Ref. Range 10/12/2018 04:01  Glucose-Capillary Latest Ref Range: 70 - 99 mg/dL 57 (L)    Treatment: D50 25 mL (12.5 gm)  Symptoms: None  Follow-up CBG: Time: CBG Result: Results for Sharon, Reid (MRN [id number]) as of 10/12/2018 04:47  Ref. Range 10/12/2018 04:29  Glucose-Capillary Latest Ref Range: 70 - 99 mg/dL 124 (H)    Possible Reasons for Event: Inadequate meal intake  Comments/MD notified:    Viviano Simas

## 2018-10-12 NOTE — TOC Transition Note (Addendum)
Transition of Care [facility]) - CM/SW Discharge Note   Patient Details  Name: Sharon Reid MRN: [id number] Date of Birth: 03/25/1951  Transition of Care [facility]) CM/SW Contact:  Alberteen Sam, [place] Phone Number: [phone] 10/12/2018, 11:17 AM   Clinical Narrative:     Patient will DC [facility] Anticipated DC date: 10/12/2018 Family notified:Lillian - lvm Transport BM:ZTAE  Per MD patient ready for DC to [facility] . RN, patient, patient's family, and facility notified of DC. Discharge Summary sent to facility. RN given number for report [phone]. DC packet on chart. Ambulance transport requested for patient.  CSW signing off.  [place], [place]    Final next level of care: [place] Barriers to Discharge: No Barriers Identified   Patient Goals and CMS Choice   CMS Medicare.gov Compare Post Acute Care list provided to:: Patient Represenative (must comment)(Lillian (friend)) Choice offered to / list presented to : (friend Judeth Porch)  Discharge Placement              Patient chooses bed at: Other - please specify in the comment section below:([facility]) Patient to be transferred to facility by: [place] Name of family member notified: Judeth Porch Patient and family notified of of transfer: 10/12/18  Discharge Plan and Services   Discharge Planning Services: CM Consult            DME Arranged: N/A DME Agency: NA       HH Arranged: NA          Social Determinants of Health (SDOH) Interventions     Readmission Risk Interventions No flowsheet data found.

## 2018-10-14 LAB — CULTURE, BLOOD (ROUTINE X 2)
Culture: NO GROWTH
Culture: NO GROWTH

## 2018-11-12 DEATH — deceased
